# Patient Record
Sex: Male | Born: 1935 | Race: White | Hispanic: No | Smoking: Former smoker
Health system: Southern US, Community
[De-identification: ages and names within clinical notes are randomized; demographics above are authoritative.]

## PROBLEM LIST (undated history)

## (undated) DIAGNOSIS — F0390 Unspecified dementia without behavioral disturbance: Secondary | ICD-10-CM

## (undated) DIAGNOSIS — I10 Essential (primary) hypertension: Secondary | ICD-10-CM

## (undated) DIAGNOSIS — E785 Hyperlipidemia, unspecified: Secondary | ICD-10-CM

## (undated) DIAGNOSIS — J449 Chronic obstructive pulmonary disease, unspecified: Secondary | ICD-10-CM

## (undated) DIAGNOSIS — K219 Gastro-esophageal reflux disease without esophagitis: Secondary | ICD-10-CM

## (undated) HISTORY — PX: NO PAST SURGERIES: SHX2092

---

## 2017-10-24 ENCOUNTER — Inpatient Hospital Stay (HOSPITAL_COMMUNITY)
Admission: EM | Admit: 2017-10-24 | Discharge: 2017-10-26 | DRG: 871 | Disposition: A | Discharge status: Home Health | Payer: Medicare Other | Attending: Internal Medicine | Admitting: Internal Medicine

## 2017-10-24 ENCOUNTER — Emergency Department (HOSPITAL_COMMUNITY): Payer: Medicare Other

## 2017-10-24 ENCOUNTER — Inpatient Hospital Stay (HOSPITAL_COMMUNITY): Payer: Medicare Other

## 2017-10-24 ENCOUNTER — Encounter (HOSPITAL_COMMUNITY): Payer: Self-pay | Admitting: Emergency Medicine

## 2017-10-24 DIAGNOSIS — A419 Sepsis, unspecified organism: Principal | ICD-10-CM

## 2017-10-24 DIAGNOSIS — J440 Chronic obstructive pulmonary disease with acute lower respiratory infection: Secondary | ICD-10-CM | POA: Diagnosis present

## 2017-10-24 DIAGNOSIS — Z9981 Dependence on supplemental oxygen: Secondary | ICD-10-CM

## 2017-10-24 DIAGNOSIS — Z885 Allergy status to narcotic agent status: Secondary | ICD-10-CM

## 2017-10-24 DIAGNOSIS — K219 Gastro-esophageal reflux disease without esophagitis: Secondary | ICD-10-CM

## 2017-10-24 DIAGNOSIS — S2231XA Fracture of one rib, right side, initial encounter for closed fracture: Secondary | ICD-10-CM | POA: Diagnosis present

## 2017-10-24 DIAGNOSIS — J181 Lobar pneumonia, unspecified organism: Secondary | ICD-10-CM | POA: Diagnosis present

## 2017-10-24 DIAGNOSIS — F0390 Unspecified dementia without behavioral disturbance: Secondary | ICD-10-CM | POA: Diagnosis present

## 2017-10-24 DIAGNOSIS — I10 Essential (primary) hypertension: Secondary | ICD-10-CM | POA: Diagnosis present

## 2017-10-24 DIAGNOSIS — G8929 Other chronic pain: Secondary | ICD-10-CM | POA: Diagnosis present

## 2017-10-24 DIAGNOSIS — Z7951 Long term (current) use of inhaled steroids: Secondary | ICD-10-CM

## 2017-10-24 DIAGNOSIS — E876 Hypokalemia: Secondary | ICD-10-CM | POA: Diagnosis present

## 2017-10-24 DIAGNOSIS — R195 Other fecal abnormalities: Secondary | ICD-10-CM | POA: Diagnosis present

## 2017-10-24 DIAGNOSIS — E785 Hyperlipidemia, unspecified: Secondary | ICD-10-CM

## 2017-10-24 DIAGNOSIS — F329 Major depressive disorder, single episode, unspecified: Secondary | ICD-10-CM | POA: Diagnosis present

## 2017-10-24 DIAGNOSIS — W19XXXA Unspecified fall, initial encounter: Secondary | ICD-10-CM | POA: Diagnosis present

## 2017-10-24 DIAGNOSIS — Z87891 Personal history of nicotine dependence: Secondary | ICD-10-CM

## 2017-10-24 DIAGNOSIS — G9341 Metabolic encephalopathy: Secondary | ICD-10-CM | POA: Diagnosis present

## 2017-10-24 DIAGNOSIS — K921 Melena: Secondary | ICD-10-CM | POA: Diagnosis present

## 2017-10-24 DIAGNOSIS — J189 Pneumonia, unspecified organism: Secondary | ICD-10-CM

## 2017-10-24 HISTORY — DX: Gastro-esophageal reflux disease without esophagitis: K21.9

## 2017-10-24 HISTORY — DX: Chronic obstructive pulmonary disease, unspecified: J44.9

## 2017-10-24 HISTORY — DX: Unspecified dementia, unspecified severity, without behavioral disturbance, psychotic disturbance, mood disturbance, and anxiety: F03.90

## 2017-10-24 HISTORY — DX: Essential (primary) hypertension: I10

## 2017-10-24 HISTORY — DX: Hyperlipidemia, unspecified: E78.5

## 2017-10-24 LAB — URINALYSIS, ROUTINE W REFLEX MICROSCOPIC
Glucose, UA: NEGATIVE mg/dL
HGB URINE DIPSTICK: NEGATIVE
Ketones, ur: 5 mg/dL — AB
Nitrite: NEGATIVE
PROTEIN: 100 mg/dL — AB
Specific Gravity, Urine: 1.026 (ref 1.005–1.030)
pH: 5 (ref 5.0–8.0)

## 2017-10-24 LAB — CBC WITH DIFFERENTIAL/PLATELET
BASOS ABS: 0.1 10*3/uL (ref 0.0–0.1)
Basophils Relative: 0 %
EOS PCT: 0 %
Eosinophils Absolute: 0.1 10*3/uL (ref 0.0–0.7)
HEMATOCRIT: 31.5 % — AB (ref 39.0–52.0)
HEMOGLOBIN: 10.4 g/dL — AB (ref 13.0–17.0)
LYMPHS ABS: 1.2 10*3/uL (ref 0.7–4.0)
LYMPHS PCT: 7 %
MCH: 34.4 pg — AB (ref 26.0–34.0)
MCHC: 33 g/dL (ref 30.0–36.0)
MCV: 104.3 fL — AB (ref 78.0–100.0)
Monocytes Absolute: 2.3 10*3/uL — ABNORMAL HIGH (ref 0.1–1.0)
Monocytes Relative: 14 %
NEUTROS ABS: 13.2 10*3/uL — AB (ref 1.7–7.7)
NEUTROS PCT: 79 %
Platelets: 334 10*3/uL (ref 150–400)
RBC: 3.02 MIL/uL — AB (ref 4.22–5.81)
RDW: 15 % (ref 11.5–15.5)
WBC: 16.9 10*3/uL — AB (ref 4.0–10.5)

## 2017-10-24 LAB — COMPREHENSIVE METABOLIC PANEL
ALT: 29 U/L (ref 17–63)
ANION GAP: 10 (ref 5–15)
AST: 34 U/L (ref 15–41)
Albumin: 3.1 g/dL — ABNORMAL LOW (ref 3.5–5.0)
Alkaline Phosphatase: 76 U/L (ref 38–126)
BUN: 15 mg/dL (ref 6–20)
CHLORIDE: 103 mmol/L (ref 101–111)
CO2: 29 mmol/L (ref 22–32)
Calcium: 8.8 mg/dL — ABNORMAL LOW (ref 8.9–10.3)
Creatinine, Ser: 1.15 mg/dL (ref 0.61–1.24)
GFR calc Af Amer: >60 mL/min (ref >60)
GFR, EST NON AFRICAN AMERICAN: 58 mL/min — AB (ref >60)
Glucose, Bld: 146 mg/dL — ABNORMAL HIGH (ref 65–99)
POTASSIUM: 3.2 mmol/L — AB (ref 3.5–5.1)
Sodium: 142 mmol/L (ref 135–145)
TOTAL PROTEIN: 7.5 g/dL (ref 6.5–8.1)
Total Bilirubin: 0.8 mg/dL (ref 0.3–1.2)

## 2017-10-24 LAB — APTT: aPTT: 32 seconds (ref 24–36)

## 2017-10-24 LAB — PROCALCITONIN: Procalcitonin: 0.29 ng/mL

## 2017-10-24 LAB — I-STAT TROPONIN, ED: TROPONIN I, POC: 0.02 ng/mL (ref 0.00–0.08)

## 2017-10-24 LAB — ABO/RH: ABO/RH(D): O POS

## 2017-10-24 LAB — TYPE AND SCREEN
ABO/RH(D): O POS
ANTIBODY SCREEN: NEGATIVE

## 2017-10-24 LAB — LIPASE, BLOOD: Lipase: 26 U/L (ref 11–51)

## 2017-10-24 LAB — LACTIC ACID, PLASMA: Lactic Acid, Venous: 1 mmol/L (ref 0.5–1.9)

## 2017-10-24 LAB — POC OCCULT BLOOD, ED: FECAL OCCULT BLD: NEGATIVE

## 2017-10-24 LAB — I-STAT CG4 LACTIC ACID, ED
Lactic Acid, Venous: 2 mmol/L (ref 0.5–1.9)
Lactic Acid, Venous: 1.15 mmol/L (ref 0.5–1.9)

## 2017-10-24 LAB — INFLUENZA PANEL BY PCR (TYPE A & B)
INFLAPCR: NEGATIVE
INFLBPCR: NEGATIVE

## 2017-10-24 LAB — PROTIME-INR
INR: 1.04
Prothrombin Time: 13.5 seconds (ref 11.4–15.2)

## 2017-10-24 MED ORDER — LISINOPRIL 20 MG PO TABS
40.0000 mg | ORAL_TABLET | Freq: Every morning | ORAL | Status: DC
  Administered 2017-10-25 – 2017-10-26 (×2): 40 mg via ORAL
  Filled 2017-10-24 (×2): qty 2

## 2017-10-24 MED ORDER — FLUTICASONE FUROATE-VILANTEROL 100-25 MCG/INH IN AEPB
1.0000 | INHALATION_SPRAY | Freq: Every day | RESPIRATORY_TRACT | Status: DC
  Administered 2017-10-25 – 2017-10-26 (×2): 1 via RESPIRATORY_TRACT
  Filled 2017-10-24: qty 28

## 2017-10-24 MED ORDER — DULOXETINE HCL 60 MG PO CPEP
60.0000 mg | ORAL_CAPSULE | Freq: Every evening | ORAL | Status: DC
  Administered 2017-10-25 (×2): 60 mg via ORAL
  Filled 2017-10-24 (×2): qty 1

## 2017-10-24 MED ORDER — HYDROCODONE-ACETAMINOPHEN 5-325 MG PO TABS: 1.0000 | ORAL_TABLET | Freq: Three times a day (TID) | ORAL | Status: DC | PRN

## 2017-10-24 MED ORDER — SODIUM CHLORIDE 0.9 % IV BOLUS (SEPSIS)
500.0000 mL | Freq: Once | INTRAVENOUS | Status: AC
  Administered 2017-10-24: 500 mL via INTRAVENOUS

## 2017-10-24 MED ORDER — FLUTICASONE PROPIONATE 50 MCG/ACT NA SUSP
1.0000 | Freq: Every day | NASAL | Status: DC
  Administered 2017-10-25 – 2017-10-26 (×2): 1 via NASAL
  Filled 2017-10-24: qty 16

## 2017-10-24 MED ORDER — GABAPENTIN 400 MG PO CAPS
400.0000 mg | ORAL_CAPSULE | Freq: Three times a day (TID) | ORAL | Status: DC
  Administered 2017-10-25 – 2017-10-26 (×5): 400 mg via ORAL
  Filled 2017-10-24 (×5): qty 1

## 2017-10-24 MED ORDER — FAMOTIDINE 20 MG PO TABS
20.0000 mg | ORAL_TABLET | Freq: Every day | ORAL | Status: DC
  Administered 2017-10-24 – 2017-10-25 (×2): 20 mg via ORAL
  Filled 2017-10-24 (×2): qty 1

## 2017-10-24 MED ORDER — SODIUM CHLORIDE 0.9 % IV BOLUS (SEPSIS)
1500.0000 mL | Freq: Once | INTRAVENOUS | Status: AC
  Administered 2017-10-24: 1000 mL via INTRAVENOUS

## 2017-10-24 MED ORDER — POTASSIUM CHLORIDE 20 MEQ/15ML (10%) PO SOLN
40.0000 meq | Freq: Once | ORAL | Status: AC
  Administered 2017-10-24: 40 meq via ORAL
  Filled 2017-10-24: qty 30

## 2017-10-24 MED ORDER — DEXTROSE 5 % IV SOLN
500.0000 mg | INTRAVENOUS | Status: AC
  Administered 2017-10-25 – 2017-10-26 (×2): 500 mg via INTRAVENOUS
  Filled 2017-10-24 (×2): qty 500

## 2017-10-24 MED ORDER — IPRATROPIUM-ALBUTEROL 0.5-2.5 (3) MG/3ML IN SOLN
3.0000 mL | RESPIRATORY_TRACT | Status: DC
  Administered 2017-10-24: 3 mL via RESPIRATORY_TRACT
  Filled 2017-10-24: qty 3

## 2017-10-24 MED ORDER — ALBUTEROL SULFATE (2.5 MG/3ML) 0.083% IN NEBU: 2.5000 mg | INHALATION_SOLUTION | Freq: Two times a day (BID) | RESPIRATORY_TRACT | Status: DC

## 2017-10-24 MED ORDER — AZITHROMYCIN 500 MG IV SOLR
500.0000 mg | Freq: Once | INTRAVENOUS | Status: AC
  Administered 2017-10-24: 500 mg via INTRAVENOUS
  Filled 2017-10-24: qty 500

## 2017-10-24 MED ORDER — CYANOCOBALAMIN 1000 MCG/ML IJ SOLN
1000.0000 ug | INTRAMUSCULAR | Status: DC
  Administered 2017-10-25: 1000 ug via INTRAMUSCULAR
  Filled 2017-10-24: qty 1

## 2017-10-24 MED ORDER — DEXTROSE 5 % IV SOLN
1.0000 g | INTRAVENOUS | Status: DC
  Administered 2017-10-25: 1 g via INTRAVENOUS
  Filled 2017-10-24 (×2): qty 10

## 2017-10-24 MED ORDER — ATORVASTATIN CALCIUM 20 MG PO TABS
20.0000 mg | ORAL_TABLET | Freq: Every day | ORAL | Status: DC
  Administered 2017-10-24 – 2017-10-25 (×2): 20 mg via ORAL
  Filled 2017-10-24 (×2): qty 1

## 2017-10-24 MED ORDER — DM-GUAIFENESIN ER 30-600 MG PO TB12
1.0000 | ORAL_TABLET | Freq: Two times a day (BID) | ORAL | Status: DC | PRN
  Filled 2017-10-24: qty 1

## 2017-10-24 MED ORDER — LORATADINE 10 MG PO TABS
10.0000 mg | ORAL_TABLET | Freq: Every day | ORAL | Status: DC
  Administered 2017-10-25 – 2017-10-26 (×3): 10 mg via ORAL
  Filled 2017-10-24 (×3): qty 1

## 2017-10-24 MED ORDER — DEXTROSE 5 % IV SOLN
1.0000 g | Freq: Once | INTRAVENOUS | Status: AC
  Administered 2017-10-24: 1 g via INTRAVENOUS
  Filled 2017-10-24: qty 10

## 2017-10-24 MED ORDER — BUSPIRONE HCL 10 MG PO TABS
10.0000 mg | ORAL_TABLET | Freq: Two times a day (BID) | ORAL | Status: DC
  Administered 2017-10-25 – 2017-10-26 (×4): 10 mg via ORAL
  Filled 2017-10-24 (×4): qty 1

## 2017-10-24 MED ORDER — SODIUM CHLORIDE 0.9 % IV SOLN
INTRAVENOUS | Status: DC
  Administered 2017-10-25 (×2): via INTRAVENOUS

## 2017-10-24 MED ORDER — TRAZODONE HCL 50 MG PO TABS
50.0000 mg | ORAL_TABLET | Freq: Every day | ORAL | Status: DC
  Administered 2017-10-25: 50 mg via ORAL
  Filled 2017-10-24 (×2): qty 1

## 2017-10-24 MED ORDER — PRIMIDONE 50 MG PO TABS
50.0000 mg | ORAL_TABLET | Freq: Two times a day (BID) | ORAL | Status: DC
  Administered 2017-10-24 – 2017-10-26 (×4): 50 mg via ORAL
  Filled 2017-10-24 (×4): qty 1

## 2017-10-24 MED ORDER — TIOTROPIUM BROMIDE MONOHYDRATE 18 MCG IN CAPS: 1.0000 | ORAL_CAPSULE | Freq: Every day | RESPIRATORY_TRACT | Status: DC

## 2017-10-24 MED ORDER — ALBUTEROL SULFATE (2.5 MG/3ML) 0.083% IN NEBU: 2.5000 mg | INHALATION_SOLUTION | RESPIRATORY_TRACT | Status: DC | PRN

## 2017-10-24 MED ORDER — IPRATROPIUM-ALBUTEROL 0.5-2.5 (3) MG/3ML IN SOLN
3.0000 mL | Freq: Two times a day (BID) | RESPIRATORY_TRACT | Status: DC
  Administered 2017-10-25 – 2017-10-26 (×3): 3 mL via RESPIRATORY_TRACT
  Filled 2017-10-24 (×3): qty 3

## 2017-10-24 NOTE — ED Notes (Signed)
Patient's daughter, Willia CrazeLinda Lopez, called stating she will be back in the morning to check on patient but can be reached at [phone] if needed.

## 2017-10-24 NOTE — ED Notes (Signed)
Patient transported to CT 

## 2017-10-24 NOTE — ED Notes (Signed)
ED Provider at bedside. 

## 2017-10-24 NOTE — ED Provider Notes (Signed)
[facility]-EMERGENCY DEPT Provider Note   CSN: [id number] Arrival date & time: 10/24/17  1202     History   Chief Complaint Chief Complaint  Patient presents with  . Weakness  . Melena    HPI Misha Antonini is a 82 y.o. male.  HPI 82 year old Caucasian who is unable to give me a full history and physical who has no prior medical records in our system presents to the ED from EMS today from home with complaints of generalized weakness and dark tarry stools.  Patient reports that his stools have been darker for the past 3 days.  Also reports generalized abdominal pain.  Again patient is a poor historian. Seems confused. Per EMS patient has had increased weakness and falls of the past 3 days with black tarry stools.  Family is not at bedside at this time.  Patient reports some right rib pain from his fall yesterday.  He does deny any head injuries or loss of consciousness.  Denies any associated chest pain or shortness of breath.  Patient denies diarrhea.  Denies any cough, fever.  Does report some difficulty urinating.  Was able to review patient's prior notes from [facility].  The patient does have a chronic COPD on 2 L of oxygen at baseline, chronic dizziness, chronic pain, hypertension.  She was seen by PCP on 06/2017 for checkup at that time.  Patient had unremarkable blood work.  Chest x-ray at that time was normal.   Past Medical History:  Diagnosis Date  . COPD (chronic obstructive pulmonary disease) (HCC)     There are no active problems to display for this patient.        Home Medications    Prior to Admission medications   Not on File    Family History No family history on file.  Social History Social History   Tobacco Use  . Smoking status: Not on file  Substance Use Topics  . Alcohol use: Not on file  . Drug use: Not on file     Allergies   Patient has no known allergies.   Review of Systems Review of Systems    Constitutional: Positive for fatigue. Negative for chills and fever.  HENT: Negative for congestion.   Eyes: Negative for visual disturbance.  Respiratory: Negative for shortness of breath.   Cardiovascular: Negative for chest pain.  Gastrointestinal: Positive for abdominal pain and blood in stool. Negative for constipation, diarrhea, nausea, rectal pain and vomiting.  Genitourinary: Positive for decreased urine volume. Negative for dysuria, flank pain, frequency, hematuria and urgency.  Musculoskeletal: Negative for myalgias.  Skin: Negative for rash.  Neurological: Positive for weakness. Negative for dizziness, light-headedness, numbness and headaches.     Physical Exam Updated Vital Signs BP 130/60 (BP Location: Right Arm)   Pulse (!) 103   Temp 99.1 F (37.3 C) (Rectal)   Resp (!) 25   SpO2 100%   Physical Exam  Constitutional: He is oriented to person, place, and time.  Non-toxic appearance. No distress.  Chronically ill-appearing.Cachetic ill appearing male wearing oxygen 2 L02 [place]  HENT:  Head: Normocephalic and atraumatic.  Mouth/Throat: Oropharynx is clear and moist.  Eyes: Conjunctivae are normal. Pupils are equal, round, and reactive to light. Right eye exhibits no discharge. Left eye exhibits no discharge.  Neck: Normal range of motion. Neck supple.  No c spine midline tenderness. No paraspinal tenderness. No deformities or step offs noted. Full ROM. Supple. No nuchal rigidity.    Cardiovascular:  Regular rhythm, normal heart sounds and intact distal pulses. Exam reveals no gallop and no friction rub.  No murmur heard. Tachycardia  Pulmonary/Chest: Effort normal and breath sounds normal. No stridor. No respiratory distress. He has no wheezes. He has no rales. He exhibits tenderness (right lower rib cage).  Abdominal: Soft. He exhibits no distension. Bowel sounds are increased. There is generalized tenderness. There is no rigidity, no rebound, no guarding, no CVA  tenderness, no tenderness at McBurney's point and negative Murphy's sign.  Genitourinary:  Genitourinary Comments: Chaperone present for exam. Pt tolerated without difficulty. No external hemorrhoids or fissures noted. No pain with palpation of the rectal vault. No internal hemorrhoids noted. Soft brown stool noted in the rectal vault. No gross hematochezia or melena. Hemoccult negative.   Musculoskeletal: Normal range of motion. He exhibits no tenderness.  ,No midline T spine or L spine tenderness. No deformities or step offs noted. Full ROM. Pelvis is stable.   Lymphadenopathy:    He has no cervical adenopathy.  Neurological: He is alert and oriented to person, place, and time.  Patient alert and oriented x3.  Follows, appropriately.  Answers questions properly.  All extremities any difficulties.  Skin: Skin is warm and dry. Capillary refill takes less than 2 seconds. No rash noted. There is pallor.  Psychiatric: His behavior is normal. Judgment and thought content normal.  Nursing note and vitals reviewed.    ED Treatments / Results  Labs (all labs ordered are listed, but only abnormal results are displayed) Labs Reviewed  COMPREHENSIVE METABOLIC PANEL - Abnormal; Notable for the following components:      Result Value   Potassium 3.2 (*)    Glucose, Bld 146 (*)    Calcium 8.8 (*)    Albumin 3.1 (*)    GFR calc non Af Amer 58 (*)    All other components within normal limits  CBC WITH DIFFERENTIAL/PLATELET - Abnormal; Notable for the following components:   WBC 16.9 (*)    RBC 3.02 (*)    Hemoglobin 10.4 (*)    HCT 31.5 (*)    MCV 104.3 (*)    MCH 34.4 (*)    Neutro Abs 13.2 (*)    Monocytes Absolute 2.3 (*)    All other components within normal limits  URINALYSIS, ROUTINE W REFLEX MICROSCOPIC - Abnormal; Notable for the following components:   Color, Urine AMBER (*)    APPearance HAZY (*)    Bilirubin Urine SMALL (*)    Ketones, ur 5 (*)    Protein, ur 100 (*)     Leukocytes, UA SMALL (*)    Bacteria, UA FEW (*)    Squamous Epithelial / LPF 0-5 (*)    All other components within normal limits  I-STAT CG4 LACTIC ACID, ED - Abnormal; Notable for the following components:   Lactic Acid, Venous 2.00 (*)    All other components within normal limits  URINE CULTURE  CULTURE, BLOOD (ROUTINE X 2)  CULTURE, BLOOD (ROUTINE X 2)  LIPASE, BLOOD  I-STAT TROPONIN, ED  POC OCCULT BLOOD, ED  I-STAT CG4 LACTIC ACID, ED    EKG  EKG Interpretation None       Radiology Dg Ribs Unilateral W/chest Right  Result Date: 10/24/2017 CLINICAL DATA:  Increase weakness and falls and dark tarry stools for the past 3 days. The patient is complaining of pain over the right lower lateral ribcage. EXAM: RIGHT RIBS AND CHEST - 3+ VIEW COMPARISON:  None in PACs FINDINGS:  The lungs are mildly hyperinflated. There is hazy increased density at the left lung base worrisome for pneumonia. There is no pleural effusion or pneumothorax. The heart and pulmonary vascularity are normal. There is calcification in the wall of the aortic arch. Right rib detail radiographs include a metallic BB over the symptomatic lower lateral right ribs. There is subtle contour deformity of the anterior lateral aspect of the right tenth rib. IMPRESSION: Probable nondisplaced fracture of the anterior-lateral aspect of the right tenth rib. COPD. Increased density at the left lung base worrisome for pneumonia. Followup PA and lateral chest X-ray is recommended in 3-4 weeks following trial of antibiotic therapy to ensure resolution and exclude underlying malignancy. Thoracic aortic atherosclerosis. Electronically Signed   By: David  SwazilandJordan M.D.   On: 10/24/2017 13:46    Procedures .Critical Care Performed by: Rise MuLeaphart, Kenneth T, PA-C Authorized by: Rise MuLeaphart, Kenneth T, PA-C   Critical care provider statement:    Critical care time (minutes):  30   Critical care was necessary to treat or prevent imminent or  life-threatening deterioration of the following conditions:  Sepsis   Critical care was time spent personally by me on the following activities:  Development of treatment plan with patient or surrogate, discussions with consultants, examination of patient, evaluation of patient's response to treatment, ordering and performing treatments and interventions, ordering and review of laboratory studies, ordering and review of radiographic studies and pulse oximetry   (including critical care time)  Medications Ordered in ED Medications  azithromycin (ZITHROMAX) 500 mg in dextrose 5 % 250 mL IVPB (not administered)  cefTRIAXone (ROCEPHIN) 1 g in dextrose 5 % 50 mL IVPB (not administered)  sodium chloride 0.9 % bolus 500 mL (not administered)     Initial Impression / Assessment and Plan / ED Course  I have reviewed the triage vital signs and the nursing notes.  Pertinent labs & imaging results that were available during my care of the patient were reviewed by me and considered in my medical decision making (see chart for details).     Patient presents to the emergency department today with complaints of productive cough, generalized weakness, right rib pain, confusion.  Patient is a poor historian however did gather some information from daughter at bedside.  States that last time she was like this patient had pneumonia.  Patient does have a history of COPD is on 2 L of oxygen at baseline.  On exam patient is slightly altered.  Patient is afebrile.  Mildly tachycardic.  No hypotension.  No tachypnea noted.  Patient satting at 100% on 2 L.  On exam patient does have tenderness over the right lateral base.  No focal abdominal tenderness palpation.  No signs of ecchymosis, step-offs, edema.  No lower extremity edema or calf tenderness.  Patient has no other signs of focal trauma.  No midline neck tenderness.  Patient has no focal neuro deficit on exam.  Neurovascularly intact in all  extremities.  Patient had a leukocytosis of 16,000.  Mild hypokalemia at 3.2.  Hemoglobin of 10.4 which appears to be at patient's baseline.  Other lab work appears the patient's baseline.  UA does show too numerous to count WBCs few bacteria.  Patient's lipase is normal.  Troponin is negative.  Lactic acid is mildly elevated at 2.  Chest x-ray performed shows a nondisplaced rib fracture of the right 10th rib.  Also shows a left base infiltrate concerning for developing pneumonia.  Patient reports productive cough that is worse  than baseline with chills and some confusion.  Given the elevated lactic acid with tachycardia, tachypnea will start sepsis protocol.  Patient does not have a lactic acid before and is not hypotensive.  30 cc/kg fluid bolus was not ordered.  Blood cultures were obtained prior to antibiotic initiation.  Patient will be given gentle fluid resuscitation.  Urine culture was sent.  Patient treated with Rocephin and azithromycin.  Spoke with Dr. Clyde LundborgNiu with hospital medicine who agrees to admission and will see patient in the ED and place admission orders.  Patient remains hemodynamically stable at this time.  No hypotension noted.  Updated on plan of care.    Final Clinical Impressions(s) / ED Diagnoses   Final diagnoses:  Community acquired pneumonia of left lower lobe of lung (HCC)  Sepsis, due to unspecified organism [facility](HCC)    ED Discharge Orders    None       Wallace KellerLeaphart, Kenneth T, PA-C 10/24/17 1554    Rolland PorterJames, Mark, MD 10/26/17 [phone]

## 2017-10-24 NOTE — Progress Notes (Signed)
A consult was received from an ED physician for zithromax/rocephin per pharmacy dosing.  The patient's profile has been reviewed for ht/wt/allergies/indication/available labs.  A one time order has been placed for rocephin 1g and zithromax 500mg .  Further antibiotics/pharmacy consults should be ordered by admitting physician if indicated.                       Thank you, Berkley HarveyLegge, [place] Mallozzi Marshall 10/24/2017  2:53 PM

## 2017-10-24 NOTE — ED Notes (Signed)
Family at bedside. 

## 2017-10-24 NOTE — H&P (Addendum)
History and Physical    Adrian [name] ZHY:[id number] DOB: [birth year] DOA: 10/24/2017  Referring MD/NP/PA:   PCP: Adrian [name]   Patient coming from:  The patient is coming from home.  At baseline, pt is independent for most of ADL.   Chief Complaint: Confusion, cough, shortness of breath,  tarry stool, fall   HPI: Adrian [name] is a 82 y.o. male with medical history significant of hypertension, hyperlipidemia, COPD, on 2 L nasal cannula oxygen at home, GERD, depression, dementia, who presents with confusion, cough, shortness breath, tarry stool.  Patient states that he has been having cough, shortness of breath recently, which has worsened since yesterday.  She coughs up white mucus, fever or chills.  Patient states that he had fall caused injury to bilateral lower rib cage.  He has moderate pain in lower rib cages bilaterally.  Patient reports that she noted tarry and dark stool in the past 3 days.  He denies nausea, vomiting, diarrhea or abdominal pain.  He does not have chest pain, but reports chest wall pain.  He moves all extremities normally.  Patient is confused, disoriented to place, but not to time and person.  ED Course: pt was found to have WBC 16.9, urinalysis with small amount of leukocyte, negative FOBT, potassium 3.2, creatinine 1.15, temperature 95.1, tachycardia, tachypnea, oxygen saturation 85% on room air.  X-ray of ribs/chest showed possible right 10th rib fracture and possible left lower field infiltration.  Patient is admitted to telemetry bed as inpatient.  Review of Systems:   General: no fevers, chills, no body weight gain, has poor appetite, has fatigue HEENT: no blurry vision, hearing changes or sore throat Respiratory: has dyspnea, coughing, no wheezing CV: no chest pain, no palpitations. Has rib cage pain. GI: no nausea, vomiting, abdominal pain, diarrhea, constipation GU: no dysuria, burning on urination, increased urinary frequency, hematuria  Ext:  no leg edema Neuro: no unilateral weakness, numbness, or tingling, no vision change or hearing loss Skin: no rash, no skin tear. MSK: No muscle spasm, no deformity, no limitation of range of movement in spin Heme: No easy bruising.  Travel history: No recent long distant travel.  Allergy:  Allergies  Allergen Reactions  . Oxycodone Other (See Comments)    Other reaction(s): ANOREXIA,SLEEPLESSNES FAMILY MEMBER SAYS HE CAN TOLERATE 5 MG WELL    Past Medical History:  Diagnosis Date  . COPD (chronic obstructive pulmonary disease) (HCC)   . Dementia   . Essential hypertension   . GERD (gastroesophageal reflux disease)   . HLD (hyperlipidemia)     Past Surgical History:  Procedure Laterality Date  . NO PAST SURGERIES      Social History:  reports that he quit smoking about 5 years ago. He has quit using smokeless tobacco. His smokeless tobacco use included chew. He reports that he does not drink alcohol or use drugs.  Family History: could not be reviewed due to altered mental status.  Prior to Admission medications   Medication Sig Start Date End Date Taking? Authorizing Provider  atorvastatin (LIPITOR) 20 MG tablet Take 20 mg by mouth daily. 07/26/17   [provider]  BREO ELLIPTA 100-25 MCG/INH AEPB Inhale 1 puff into the lungs daily. 10/18/17   [provider]  busPIRone (BUSPAR) 10 MG tablet Take 10 mg by mouth 2 (two) times daily as needed for anxiety. 10/18/17   [provider]  cetirizine (ZYRTEC) 10 MG tablet Take 10 mg by mouth daily. 10/18/17   [provider]  cyanocobalamin (,VITAMIN B-12,) 1000 MCG/ML injection Inject 1,000 mcg into the muscle every 30 (thirty) days. 09/30/17   [provider]  DULoxetine (CYMBALTA) 60 MG capsule Take 60 mg by mouth every evening. 08/23/17   [provider]  gabapentin (NEURONTIN) 100 MG capsule Take 100 mg by mouth. FOLLOW TAPER SCHEDULE 05/10/17 05/10/18  [provider]    gabapentin (NEURONTIN) 400 MG capsule Take 400 mg by mouth. FOLLOW TAPER SCHEDULE 08/07/17   [provider]  gabapentin (NEURONTIN) 600 MG tablet Take 600 mg by mouth every 8 (eight) hours. 10/05/17   [provider]  HYDROcodone-acetaminophen (NORCO/VICODIN) 5-325 MG tablet Take 1 tablet by mouth every 8 (eight) hours as needed for pain. 07/05/17   [provider]  ipratropium (ATROVENT) 0.03 % nasal spray  10/23/17   [provider]  lisinopril (PRINIVIL,ZESTRIL) 40 MG tablet Take 40 mg by mouth every morning. 08/23/17   [provider]  mometasone (NASONEX) 50 MCG/ACT nasal spray SHAKE LQ AND U 1 SPR IEN D 08/30/17   [provider]  primidone (MYSOLINE) 50 MG tablet Take 50 mg by mouth 2 (two) times daily. 07/30/17   [provider]  PROAIR HFA 108 (90 Base) MCG/ACT inhaler Inhale 2 puffs into the lungs every 4 (four) hours as needed for wheezing. 07/30/17   [provider]  ranitidine (ZANTAC) 150 MG tablet Take 150 mg by mouth 4 (four) times daily. 08/30/17   [provider]  tiotropium (SPIRIVA) 18 MCG inhalation capsule Place 1 capsule into inhaler and inhale. 09/04/16   [provider]  traZODone (DESYREL) 50 MG tablet Take 50 mg by mouth. 30 MINUTES BEFORE BED FOR INSOMNIA 08/27/17   [provider]    Physical Exam: Vitals:   10/24/17 1656 10/24/17 1730 10/24/17 1800 10/24/17 1929  BP:  (!) 138/59 134/76   Pulse:  88 74   Resp:  (!) 24 (!) 24   Temp:      TempSrc:      SpO2:  98% 97% 98%  Weight: 50.3 kg (111 lb)      General: Not in acute distress.  Cachectic looking. HEENT:       Eyes: PERRL, EOMI, no scleral icterus.       ENT: No discharge from the ears and nose, no pharynx injection, no tonsillar enlargement.        Neck: No JVD, no bruit, no mass felt. Heme: No neck lymph node enlargement. Cardiac: S1/S2, RRR, No murmurs, No gallops or rubs. Respiratory: No rales,  wheezing, rhonchi or rubs. GI: Soft, nondistended, nontender, no rebound pain, no organomegaly, BS present. GU: No hematuria Ext: No pitting leg edema bilaterally. 2+DP/PT pulse bilaterally. Musculoskeletal: No joint deformities, No joint redness or warmth, no limitation of ROM in spin. Skin: No rashes.  Neuro: Alert, oriented to place, but not to time X3, cranial nerves II-XII grossly intact, moves all extremities normally.  Psych: Patient is not psychotic, no suicidal or hemocidal ideation.  Labs on Admission: I have personally reviewed following labs and imaging studies  CBC: Recent Labs  Lab 10/24/17 1300  WBC 16.9*  NEUTROABS 13.2*  HGB 10.4*  HCT 31.5*  MCV 104.3*  PLT 334   Basic Metabolic Panel: Recent Labs  Lab 10/24/17 1300  NA 142  K 3.2*  CL 103  CO2 29  GLUCOSE 146*  BUN 15  CREATININE 1.15  CALCIUM 8.8*   GFR: CrCl cannot be calculated (Unknown ideal weight.).  Liver Function Tests: Recent Labs  Lab 10/24/17 1300  AST 34  ALT 29  ALKPHOS 76  BILITOT 0.8  PROT 7.5  ALBUMIN 3.1*   Recent Labs  Lab 10/24/17 1300  LIPASE 26   No results for input(s): AMMONIA in the last 168 hours. Coagulation Profile: No results for input(s): INR, PROTIME in the last 168 hours. Cardiac Enzymes: No results for input(s): CKTOTAL, CKMB, CKMBINDEX, TROPONINI in the last 168 hours. BNP (last 3 results) No results for input(s): PROBNP in the last 8760 hours. HbA1C: No results for input(s): HGBA1C in the last 72 hours. CBG: No results for input(s): GLUCAP in the last 168 hours. Lipid Profile: No results for input(s): CHOL, HDL, LDLCALC, TRIG, CHOLHDL, LDLDIRECT in the last 72 hours. Thyroid Function Tests: No results for input(s): TSH, T4TOTAL, FREET4, T3FREE, THYROIDAB in the last 72 hours. Anemia Panel: No results for input(s): VITAMINB12, FOLATE, FERRITIN, TIBC, IRON, RETICCTPCT in the last 72 hours. Urine analysis:    Component Value Date/Time    COLORURINE AMBER (A) 10/24/2017 1452   APPEARANCEUR HAZY (A) 10/24/2017 1452   LABSPEC 1.026 10/24/2017 1452   PHURINE 5.0 10/24/2017 1452   GLUCOSEU NEGATIVE 10/24/2017 1452   HGBUR NEGATIVE 10/24/2017 1452   BILIRUBINUR SMALL (A) 10/24/2017 1452   KETONESUR 5 (A) 10/24/2017 1452   PROTEINUR 100 (A) 10/24/2017 1452   NITRITE NEGATIVE 10/24/2017 1452   LEUKOCYTESUR SMALL (A) 10/24/2017 1452   Sepsis Labs: @LABRCNTIP (procalcitonin:4,lacticidven:4) )No results found for this or any previous visit (from the past 240 hour(s)).   Radiological Exams on Admission: Dg Ribs Unilateral W/chest Right  Result Date: 10/24/2017 CLINICAL DATA:  Increase weakness and falls and dark tarry stools for the past 3 days. The patient is complaining of pain over the right lower lateral ribcage. EXAM: RIGHT RIBS AND CHEST - 3+ VIEW COMPARISON:  None in PACs FINDINGS: The lungs are mildly hyperinflated. There is hazy increased density at the left lung base worrisome for pneumonia. There is no pleural effusion or pneumothorax. The heart and pulmonary vascularity are normal. There is calcification in the wall of the aortic arch. Right rib detail radiographs include a metallic BB over the symptomatic lower lateral right ribs. There is subtle contour deformity of the anterior lateral aspect of the right tenth rib. IMPRESSION: Probable nondisplaced fracture of the anterior-lateral aspect of the right tenth rib. COPD. Increased density at the left lung base worrisome for pneumonia. Followup PA and lateral chest X-ray is recommended in 3-4 weeks following trial of antibiotic therapy to ensure resolution and exclude underlying malignancy. Thoracic aortic atherosclerosis. Electronically Signed   By: David  [place] M.D.   On: 10/24/2017 13:46   Ct Head Wo Contrast  Result Date: 10/24/2017 CLINICAL DATA:  82 year old male with history of weakness and falling EXAM: CT HEAD WITHOUT CONTRAST CT CERVICAL SPINE WITHOUT CONTRAST  TECHNIQUE: Multidetector CT imaging of the head and cervical spine was performed following the standard protocol without intravenous contrast. Multiplanar CT image reconstructions of the cervical spine were also generated. COMPARISON:  None. FINDINGS: CT HEAD FINDINGS Brain: No acute hemorrhage. No midline shift or mass effect. Unremarkable configuration of the ventricles. Gray-white differentiation maintained. Mild brain volume loss. Vascular: Calcifications of the anterior circulation Skull: No acute fracture.  No aggressive bony lesions. Sinuses/Orbits: Unremarkable orbits. Unremarkable paranasal sinuses without significant paranasal sinus disease. Other: None CT CERVICAL SPINE FINDINGS Alignment: Alignment maintained. No subluxation. Cranial cervical junction maintained Skull base and vertebrae: Craniocervical junction maintained. No skullbase fracture.  No acute fracture of the vertebral bodies. Facet alignment maintained. Vertebral body heights maintained. Soft tissues and spinal canal: No prevertebral soft tissue swelling. No canal hematoma Disc levels: C2-C3: Disc space narrowing with uncovertebral joint disease and no significant foraminal narrowing. Facet disease present. C3-C4: Degenerative disc disease with bilateral uncovertebral joint disease and facet disease contributes to mild bilateral foraminal narrowing. C4-C5: Degenerative disc disease with posterior disc osteophyte complex and bilateral uncovertebral joint disease contributes to left greater than right foraminal narrowing. C5-C6: Disc space narrowing with bilateral uncovertebral joint disease contributes to left greater than right foraminal narrowing. C6-C7: Disc disease with posterior disc osteophyte complex and no significant canal narrowing or foraminal narrowing. C7-T1: No significant canal narrowing or foraminal narrowing. Upper chest: Emphysema of the lung apices. Pleuroparenchymal scarring. Other: Carotid and vertebral artery  calcifications. IMPRESSION: Head CT: Head CT negative for acute intracranial abnormality. Mild senescent brain volume loss. Intracranial atherosclerosis. Cervical CT: No CT evidence of acute fracture malalignment of the cervical spine. Calcifications of the bilateral carotid arteries and vertebral arteries. Multilevel degenerative disc disease with associated uncovertebral joint disease contributing to left-greater-than-right foraminal narrowing at C4-C5 and C5-C6. Emphysema Electronically Signed   By: Gilmer Mor D.O.   On: 10/24/2017 18:56   Ct Cervical Spine Wo Contrast  Result Date: 10/24/2017 CLINICAL DATA:  82 year old male with history of weakness and falling EXAM: CT HEAD WITHOUT CONTRAST CT CERVICAL SPINE WITHOUT CONTRAST TECHNIQUE: Multidetector CT imaging of the head and cervical spine was performed following the standard protocol without intravenous contrast. Multiplanar CT image reconstructions of the cervical spine were also generated. COMPARISON:  None. FINDINGS: CT HEAD FINDINGS Brain: No acute hemorrhage. No midline shift or mass effect. Unremarkable configuration of the ventricles. Gray-white differentiation maintained. Mild brain volume loss. Vascular: Calcifications of the anterior circulation Skull: No acute fracture.  No aggressive bony lesions. Sinuses/Orbits: Unremarkable orbits. Unremarkable paranasal sinuses without significant paranasal sinus disease. Other: None CT CERVICAL SPINE FINDINGS Alignment: Alignment maintained. No subluxation. Cranial cervical junction maintained Skull base and vertebrae: Craniocervical junction maintained. No skullbase fracture. No acute fracture of the vertebral bodies. Facet alignment maintained. Vertebral body heights maintained. Soft tissues and spinal canal: No prevertebral soft tissue swelling. No canal hematoma Disc levels: C2-C3: Disc space narrowing with uncovertebral joint disease and no significant foraminal narrowing. Facet disease present.  C3-C4: Degenerative disc disease with bilateral uncovertebral joint disease and facet disease contributes to mild bilateral foraminal narrowing. C4-C5: Degenerative disc disease with posterior disc osteophyte complex and bilateral uncovertebral joint disease contributes to left greater than right foraminal narrowing. C5-C6: Disc space narrowing with bilateral uncovertebral joint disease contributes to left greater than right foraminal narrowing. C6-C7: Disc disease with posterior disc osteophyte complex and no significant canal narrowing or foraminal narrowing. C7-T1: No significant canal narrowing or foraminal narrowing. Upper chest: Emphysema of the lung apices. Pleuroparenchymal scarring. Other: Carotid and vertebral artery calcifications. IMPRESSION: Head CT: Head CT negative for acute intracranial abnormality. Mild senescent brain volume loss. Intracranial atherosclerosis. Cervical CT: No CT evidence of acute fracture malalignment of the cervical spine. Calcifications of the bilateral carotid arteries and vertebral arteries. Multilevel degenerative disc disease with associated uncovertebral joint disease contributing to left-greater-than-right foraminal narrowing at C4-C5 and C5-C6. Emphysema Electronically Signed   By: Gilmer Mor D.O.   On: 10/24/2017 18:56     EKG: Independently reviewed.  Sinus rhythm, QTC 482, LAD, nonspecific T wave change. Assessment/Plan Principal Problem:   Lobar pneumonia (HCC) Active Problems:   Essential  hypertension   HLD (hyperlipidemia)   GERD (gastroesophageal reflux disease)   Dark stools   Fall   Sepsis (HCC)   Lobar pneumonia in the setting of COPD and sepsis: Patient admits critical for sepsis with leukocytosis, tachycardia and tachypnea.  Lactic acid is 2.0.  Hemodynamically stable.  - will admit to tele bed as inpt - IV Rocephin and azithromycin - Mucinex for cough  -Bronchodilators - Urine legionella and S. pneumococcal antigen - Follow up blood  culture x2, sputum culture and respiratory virus panel - will get Procalcitonin and trend lactic acid level [name] sepsis protocol - IVF: 2L of NS bolus in ED, followed by 75 mL [name] hour of NS  HTN:  -Continue home medications: Lisinopril -IV hydralazine prn  HLD: -lipitor  GERD: -Pepcid  Dark stooL; FOBT negative.  Hemoglobin stable at 10.4 -Follow-up for hemoglobin CBC  Fall: Patient has a rib fracture. -PRN Norco for pan -f/u CT-head and neck -Pt/ot  Acute metabolic encephalopathy: Likely due to ongoing infection -Follow-up CT- head and neck -Frequent neuro check    DVT ppx: SCD Code Status: Full code Family Communication: None at bed side.  Disposition Plan:  Anticipate discharge back to previous home environment Consults called:  none Admission status:   Inpatient/tele     Date of Service 10/24/2017    Lorretta Harp [facility] Pager [phone]  If 7PM-7AM, please contact night-coverage [email or web address] Password [email or web address] 10/24/2017, 7:45 PM

## 2017-10-24 NOTE — ED Triage Notes (Signed)
Per EMS, patient from home with family, family reports dark tarry stools with increased weakness and falls x3 days. A&O to baseline. Hx COPD. Denies pain.  BP 120/70 HR 85 RR 28 CBG 250

## 2017-10-25 ENCOUNTER — Other Ambulatory Visit: Payer: Self-pay

## 2017-10-25 DIAGNOSIS — J181 Lobar pneumonia, unspecified organism: Secondary | ICD-10-CM

## 2017-10-25 LAB — BLOOD CULTURE ID PANEL (REFLEXED)
ACINETOBACTER BAUMANNII: NOT DETECTED
Candida albicans: NOT DETECTED
Candida glabrata: NOT DETECTED
Candida krusei: NOT DETECTED
Candida parapsilosis: NOT DETECTED
Candida tropicalis: NOT DETECTED
ENTEROCOCCUS SPECIES: NOT DETECTED
Enterobacter cloacae complex: NOT DETECTED
Enterobacteriaceae species: NOT DETECTED
Escherichia coli: NOT DETECTED
HAEMOPHILUS INFLUENZAE: NOT DETECTED
Klebsiella oxytoca: NOT DETECTED
Klebsiella pneumoniae: NOT DETECTED
LISTERIA MONOCYTOGENES: NOT DETECTED
METHICILLIN RESISTANCE: DETECTED — AB
NEISSERIA MENINGITIDIS: NOT DETECTED
PROTEUS SPECIES: NOT DETECTED
PSEUDOMONAS AERUGINOSA: NOT DETECTED
SERRATIA MARCESCENS: NOT DETECTED
STAPHYLOCOCCUS AUREUS BCID: NOT DETECTED
STAPHYLOCOCCUS SPECIES: DETECTED — AB
STREPTOCOCCUS AGALACTIAE: NOT DETECTED
STREPTOCOCCUS PNEUMONIAE: NOT DETECTED
Streptococcus pyogenes: NOT DETECTED
Streptococcus species: NOT DETECTED

## 2017-10-25 LAB — LACTIC ACID, PLASMA: LACTIC ACID, VENOUS: 0.7 mmol/L (ref 0.5–1.9)

## 2017-10-25 LAB — URINE CULTURE

## 2017-10-25 LAB — STREP PNEUMONIAE URINARY ANTIGEN: Strep Pneumo Urinary Antigen: NEGATIVE

## 2017-10-25 LAB — MAGNESIUM: MAGNESIUM: 1.5 mg/dL — AB (ref 1.7–2.4)

## 2017-10-25 MED ORDER — POTASSIUM CHLORIDE CRYS ER 20 MEQ PO TBCR
40.0000 meq | EXTENDED_RELEASE_TABLET | Freq: Two times a day (BID) | ORAL | Status: AC
  Administered 2017-10-25 (×2): 40 meq via ORAL
  Filled 2017-10-25 (×2): qty 2

## 2017-10-25 MED ORDER — SENNA 8.6 MG PO TABS
2.0000 | ORAL_TABLET | Freq: Once | ORAL | Status: AC
  Administered 2017-10-25: 17.2 mg via ORAL
  Filled 2017-10-25: qty 2

## 2017-10-25 MED ORDER — MAGNESIUM SULFATE 4 GM/100ML IV SOLN
4.0000 g | Freq: Once | INTRAVENOUS | Status: AC
  Administered 2017-10-25: 4 g via INTRAVENOUS
  Filled 2017-10-25: qty 100

## 2017-10-25 NOTE — Plan of Care (Signed)
  Progressing Education: Knowledge of General Education information will improve 10/25/2017 0831 - Progressing by Dineen Conradt, Alben SpittleMary E, RN Health Behavior/Discharge Planning: Ability to manage health-related needs will improve 10/25/2017 0831 - Progressing by Nuriyah Hanline, Alben SpittleMary E, RN Clinical Measurements: Ability to maintain clinical measurements within normal limits will improve 10/25/2017 0831 - Progressing by Hannah Strader, Alben SpittleMary E, RN Will remain free from infection 10/25/2017 0831 - Progressing by Lindsy Cerullo, Alben SpittleMary E, RN Diagnostic test results will improve 10/25/2017 0831 - Progressing by Ewel Lona, Alben SpittleMary E, RN Respiratory complications will improve 10/25/2017 0831 - Progressing by Vale Peraza, Alben SpittleMary E, RN Cardiovascular complication will be avoided 10/25/2017 0831 - Progressing by Bronislaus Verdell, Alben SpittleMary E, RN Activity: Risk for activity intolerance will decrease 10/25/2017 0831 - Progressing by Jameriah Trotti, Alben SpittleMary E, RN Nutrition: Adequate nutrition will be maintained 10/25/2017 0831 - Progressing by Erroll Wilbourne, Alben SpittleMary E, RN Coping: Level of anxiety will decrease 10/25/2017 0831 - Progressing by Morgan Keinath, Alben SpittleMary E, RN Elimination: Will not experience complications related to bowel motility 10/25/2017 0831 - Progressing by Kamaljit Hizer, Alben SpittleMary E, RN Will not experience complications related to urinary retention 10/25/2017 0831 - Progressing by Zachari Alberta, Alben SpittleMary E, RN Pain Managment: General experience of comfort will improve 10/25/2017 0831 - Progressing by Geneva Barrero, Alben SpittleMary E, RN Safety: Ability to remain free from injury will improve 10/25/2017 0831 - Progressing by Kyrstin Campillo, Alben SpittleMary E, RN Skin Integrity: Risk for impaired skin integrity will decrease 10/25/2017 0831 - Progressing by Skiler Tye, Alben SpittleMary E, RN

## 2017-10-25 NOTE — Evaluation (Signed)
Physical Therapy Evaluation Patient Details Name: Adrian Adrian [name] MRN: [id number] DOB: 04-Sep-1935 Today's Date: 10/25/2017   History of Present Illness  82 y.o. male with medical history significant of hypertension, hyperlipidemia, COPD, on 2 L nasal cannula oxygen at home, GERD, depression, dementia. Pt was admitted with Pna, R 10th rib fx after sustaining a fall at home.   Clinical Impression  On eval, pt was Min guard-Min assist for mobility. He walked ~150 feet with use of a rollator; then another 15 feet without an assistive device. Pt remained on Adrian Adrian [name]-[name] dep at baseline. He tolerated activity well. No family present during session. Will follow and progress activity as tolerated. Recommend HHPT f/u.     Follow Up Recommendations Home health PT;Supervision/Assistance - 24 hour    Equipment Recommendations  None recommended by PT    Recommendations for Other Services       Precautions / Restrictions Precautions Precautions: Fall Precaution Comments: [name] dep Required Braces or Orthoses: (on 2 L [name] at home) Restrictions Weight Bearing Restrictions: No      Mobility  Bed Mobility Overal bed mobility: Needs Assistance Bed Mobility: Supine to Sit;Sit to Supine     Supine to sit: Supervision Sit to supine: Supervision   General bed mobility comments: for safety. Increased time.   Transfers Overall transfer level: Needs assistance Equipment used: 4-wheeled walker Transfers: Sit to/from Stand Sit to Stand: Min assist         General transfer comment: Assist to steady. Cues for hand placement. Pt was able to stand, unassisted, from the toilet with use of grabbar.   Ambulation/Gait Ambulation/Gait assistance: Min guard;Min assist Ambulation Distance (Feet): 150 Feet(15'x2, 150'x1) Assistive device: 4-wheeled walker;None Gait Pattern/deviations: Step-through pattern;Decreased stride length     General Gait Details: Min guard assist with rollator use. Min assist to  steady intermittently when walking without an assistive device. Remained on Adrian Adrian [name] [name]. Pt tolerated activity well.   Stairs            Wheelchair Mobility    Modified Rankin (Stroke Patients Only)       Balance Overall balance assessment: Needs assistance;History of Falls Sitting-balance support: Bilateral upper extremity supported;Feet supported Sitting balance-Leahy Scale: Fair Sitting balance - Comments: trunk flexion noted   Standing balance support: No upper extremity supported;Bilateral upper extremity supported;During functional activity Standing balance-Leahy Scale: Poor Standing balance comment: fair static, poor dynamic                             Pertinent Vitals/Pain Pain Assessment: No/denies pain Faces Pain Scale: Hurts little more Pain Location: back Pain Descriptors / Indicators: Aching Pain Intervention(s): Monitored during session;Repositioned    Home Living Family/patient expects to be discharged to:: Private residence Living Arrangements: Non-relatives/Friends;Children Available Help at Discharge: Family           Home Equipment: Gilmer Morane - single point Additional Comments: Pt reports his daughter assist him with "getting my legs over the tub" to get into shower. Pt reports daughter helps with bathing as well. Pt reports he mobilizes for basic ADLs (ex. to go to the bathroom). Pt is hoembound. No family present during OT evaluation.     Prior Function Level of Independence: Needs assistance   Gait / Transfers Assistance Needed: reports he has multiple canes at home but does not use them.  ADL's / Homemaking Assistance Needed: reports daughter assist with tub transfer and bathing.  Hand Dominance        Extremity/Trunk Assessment   Upper Extremity Assessment Upper Extremity Assessment: Defer to OT evaluation    Lower Extremity Assessment Lower Extremity Assessment: Generalized weakness    Cervical / Trunk  Assessment Cervical / Trunk Assessment: Kyphotic  Communication   Communication: Other (comment)(hard to understand)  Cognition Arousal/Alertness: Awake/Adrian [name] Behavior During Therapy: WFL for tasks assessed/performed Overall Cognitive Status: History of cognitive impairments - at baseline                                 General Comments: disoriented to time. states he is at [facility] Was able to verbalize that he "wasn't feeling well" as reason for coming to hospital. Decreased safety awareness (leaving IV pole behind when walking, unaware of need to adjust pants to off of heels before ambulating.       General Comments      Exercises     Assessment/Plan    PT Assessment Patient needs continued PT services  PT Problem List Decreased strength;Decreased balance;Decreased mobility;Decreased knowledge of use of DME;Decreased activity tolerance       PT Treatment Interventions DME instruction;Gait training;Functional mobility training;Balance training;Patient/family education;Therapeutic exercise    PT Goals (Current goals can be found in the Care Plan section)  Acute Rehab PT Goals Patient Stated Goal: not stated PT Goal Formulation: With patient Time For Goal Achievement: 11/08/17 Potential to Achieve Goals: Good    Frequency Min 3X/week   Barriers to discharge        Co-evaluation               AM-PAC PT "6 Clicks" Daily Activity  Outcome Measure Difficulty turning over in bed (including adjusting bedclothes, sheets and blankets)?: None Difficulty moving from lying on back to sitting on the side of the bed? : None Difficulty sitting down on and standing up from a chair with arms (e.g., wheelchair, bedside commode, etc,.)?: None Help needed moving to and from a bed to chair (including a wheelchair)?: A Little Help needed walking in hospital room?: A Little Help needed climbing 3-5 steps with a railing? : A Little 6 Click Score: 21     End of Session Equipment Utilized During Treatment: Oxygen;Gait belt Activity Tolerance: Patient tolerated treatment well Patient left: in bed;with call bell/phone within reach;with bed alarm set   PT Visit Diagnosis: Muscle weakness (generalized) (M62.81);Difficulty in walking, not elsewhere classified (R26.2)    Time: [id number] PT Time Calculation (min) (ACUTE ONLY): 22 min   Charges:   PT Evaluation $PT Eval Moderate Complexity: 1 Mod     PT G Codes:          Adrian AlertJannie Arpita Adrian [name], MPT Pager: [phone]

## 2017-10-25 NOTE — Plan of Care (Signed)
Pt reports no pain 

## 2017-10-25 NOTE — Progress Notes (Signed)
PHARMACY - PHYSICIAN COMMUNICATION CRITICAL VALUE ALERT - BLOOD CULTURE IDENTIFICATION (BCID)  Adrian [name] is an 82 y.o. male who [facility] with a chief complaint of SHOB. Treated as CAP with Rocephin/Azithromycin  Assessment:  Day 2 Rocephin/Azithromycin IV. Lactic acid decreased, Strep pneumo Ag: neg, Flu PCR: neg/neg, Legionella pending  Name of physician (or Provider) Contacted: Jerelene ReddenElizabeth Mathews  Current antibiotics: Rocephin 1gm q24, Azithromycin 500mg  q24  Changes to prescribed antibiotics recommended:  Recommendations accepted by provider : considered contaminant, no change antibiotics  Results for orders placed or performed during the hospital encounter of 10/24/17  Blood Culture ID Panel (Reflexed) (Collected: 10/24/2017  3:43 PM)  Result Value Ref Range   Enterococcus species NOT DETECTED NOT DETECTED   Listeria monocytogenes NOT DETECTED NOT DETECTED   Staphylococcus species DETECTED (A) NOT DETECTED   Staphylococcus aureus NOT DETECTED NOT DETECTED   Methicillin resistance DETECTED (A) NOT DETECTED   Streptococcus species NOT DETECTED NOT DETECTED   Streptococcus agalactiae NOT DETECTED NOT DETECTED   Streptococcus pneumoniae NOT DETECTED NOT DETECTED   Streptococcus pyogenes NOT DETECTED NOT DETECTED   Acinetobacter baumannii NOT DETECTED NOT DETECTED   Enterobacteriaceae species NOT DETECTED NOT DETECTED   Enterobacter cloacae complex NOT DETECTED NOT DETECTED   Escherichia coli NOT DETECTED NOT DETECTED   Klebsiella oxytoca NOT DETECTED NOT DETECTED   Klebsiella pneumoniae NOT DETECTED NOT DETECTED   Proteus species NOT DETECTED NOT DETECTED   Serratia marcescens NOT DETECTED NOT DETECTED   Haemophilus influenzae NOT DETECTED NOT DETECTED   Neisseria meningitidis NOT DETECTED NOT DETECTED   Pseudomonas aeruginosa NOT DETECTED NOT DETECTED   Candida albicans NOT DETECTED NOT DETECTED   Candida glabrata NOT DETECTED NOT DETECTED   Candida krusei NOT DETECTED NOT DETECTED   Candida parapsilosis NOT DETECTED NOT DETECTED   Candida tropicalis NOT DETECTED NOT DETECTED    Chilton SiGreen, Json Koelzer L 10/25/2017  1:29 PM

## 2017-10-25 NOTE — Evaluation (Signed)
Occupational Therapy Evaluation Patient Details Name: Adrian ActonDewey [name] MRN: [id number] DOB: Aug 02, 1935 Today's Date: 10/25/2017    History of Present Illness 82 y.o. male with medical history significant of hypertension, hyperlipidemia, COPD, on 2 L nasal cannula oxygen at home, GERD, depression, dementia, who presents with confusion, cough, shortness breath, tarry stool.   Clinical Impression   Pt admitted with the above diagnoses and presents with below problem list. Pt will benefit from acute continued OT to address the below listed deficits and maximize independence with basic ADLs prior to d/c to venue below. PTA pt reports needing assist for tub transfer and bathing. No family present during eval. Pt reports being home bound and usually OOB for basic ADLs only. Pt noted to have decreased safety awareness during eval. Pt currently min - mod A with ADLs and functional transfers/mobility. Pt likely currently needing a little more assist with ADLs but not too far from his baseline function.      Follow Up Recommendations  Supervision/Assistance - 24 hour;Home health OT    Equipment Recommendations  Tub/shower bench;3 in 1 bedside commode    Recommendations for Other Services PT consult     Precautions / Restrictions Precautions Precautions: Fall Required Braces or Orthoses: (on 2 L O2 at home) Restrictions Weight Bearing Restrictions: No      Mobility Bed Mobility Overal bed mobility: Needs Assistance Bed Mobility: Supine to Sit     Supine to sit: HOB elevated;Min assist     General bed mobility comments: assist to power up trunk  Transfers Overall transfer level: Needs assistance Equipment used: None;Rolling walker (2 wheeled) Transfers: Sit to/from Stand Sit to Stand: Min guard;Min assist         General transfer comment: first without AD then used rw. min A to steady. cues for hand placement.     Balance Overall balance assessment: Needs  assistance Sitting-balance support: Bilateral upper extremity supported;Feet supported Sitting balance-Leahy Scale: Fair Sitting balance - Comments: trunk flexion noted   Standing balance support: No upper extremity supported;Bilateral upper extremity supported;During functional activity Standing balance-Leahy Scale: Poor Standing balance comment: fair static, poor dynamic                           ADL either performed or assessed with clinical judgement   ADL Overall ADL's : Needs assistance/impaired Eating/Feeding: Set up;Sitting   Grooming: Minimal assistance;Sitting   Upper Body Bathing: Moderate assistance;Sitting   Lower Body Bathing: Moderate assistance;Sit to/from stand   Upper Body Dressing : Minimal assistance;Sitting   Lower Body Dressing: Sit to/from stand;Minimal assistance   Toilet Transfer: Minimal assistance;Ambulation;RW   Toileting- Clothing Manipulation and Hygiene: Moderate assistance;Sit to/from stand Toileting - Clothing Manipulation Details (indicate cue type and reason): Pt stood with rw while therapist completed pericare. Tub/ Shower Transfer: Tub transfer;Moderate assistance;Ambulation;Tub bench;Rolling walker   Functional mobility during ADLs: Minimal assistance;Rolling walker General ADL Comments: Pt completed bed mobility, in-room functional mobility, toilet transfer and pericare as detailed above.      Vision         Perception     Praxis      Pertinent Vitals/Pain Pain Assessment: Faces Faces Pain Scale: Hurts little more Pain Location: back Pain Descriptors / Indicators: Aching Pain Intervention(s): Monitored during session;Repositioned     Hand Dominance     Extremity/Trunk Assessment Upper Extremity Assessment Upper Extremity Assessment: Generalized weakness   Lower Extremity Assessment Lower Extremity Assessment: Defer to PT evaluation;Generalized weakness  Communication Communication Communication:  Other (comment)(low volume)   Cognition Arousal/Alertness: Awake/alert(initially lethargic but more alert once EOB) Behavior During Therapy: WFL for tasks assessed/performed Overall Cognitive Status: No family/caregiver present to determine baseline cognitive functioning                                 General Comments: disoriented to time. states [facility] is [facility] Was able to verbalize that he "wasn't feeling well" as reason for coming to hospital. Decreased safety awareness (leaving IV pole behind when walking, unaware of need to adjust pants to off of heels before ambulating.    General Comments       Exercises     Shoulder Instructions      Home Living Family/patient expects to be discharged to:: Private residence Living Arrangements: Non-relatives/Friends;Children Available Help at Discharge: Family               Bathroom Shower/Tub: Tub/shower unit   Bathroom Toilet: Standard     Home Equipment: Cane - single point;Crutches   Additional Comments: Pt reports his daughter assist him with "getting my legs over the tub" to get into shower. Pt reports daughter helps with bathing as well. Pt reports he mobilizes for basic ADLs (ex. to go to the bathroom). Pt is hoembound. No family present during OT evaluation.       Prior Functioning/Environment Level of Independence: Needs assistance  Gait / Transfers Assistance Needed: reports he has multiple canes and crutched at home but does not use them. Home bound. ADL's / Homemaking Assistance Needed: reports daughter assist with tub transfer and bathing.             OT Problem List: Decreased strength;Decreased activity tolerance;Impaired balance (sitting and/or standing);Decreased safety awareness;Decreased knowledge of use of DME or AE;Decreased knowledge of precautions;Pain      OT Treatment/Interventions:      OT Goals(Current goals can be found in the care plan section) Acute Rehab OT  Goals Patient Stated Goal: not stated OT Goal Formulation: With patient Time For Goal Achievement: 11/08/17 Potential to Achieve Goals: Good ADL Goals Pt Will Perform Grooming: with min guard assist;standing;sitting;with set-up Pt Will Perform Upper Body Dressing: with set-up;sitting Pt Will Perform Lower Body Dressing: with min guard assist;sit to/from stand;with adaptive equipment Pt Will Transfer to Toilet: with min guard assist;ambulating Pt Will Perform Toileting - Clothing Manipulation and hygiene: with min guard assist;sit to/from stand Pt Will Perform Tub/Shower Transfer: Tub transfer;with min guard assist;ambulating;tub bench;with min assist;rolling walker;shower seat  OT Frequency: Min 2X/week   Barriers to D/C:            Co-evaluation              AM-PAC PT "6 Clicks" Daily Activity     Outcome Measure Help from another person eating meals?: None Help from another person taking care of personal grooming?: A Little Help from another person toileting, which includes using toliet, bedpan, or urinal?: A Lot Help from another person bathing (including washing, rinsing, drying)?: A Lot Help from another person to put on and taking off regular upper body clothing?: A Little Help from another person to put on and taking off regular lower body clothing?: A Lot 6 Click Score: 16   End of Session Equipment Utilized During Treatment: Gait belt;Rolling walker;Oxygen  Activity Tolerance: Patient limited by lethargy;Patient tolerated treatment well Patient left: Other (comment)(on BSC with NT present )  OT Visit Diagnosis: Unsteadiness on feet (R26.81);Muscle weakness (generalized) (M62.81);Pain                Time: 5784-6962 OT Time Calculation (min): 31 min Charges:  OT General Charges $OT Visit: 1 Visit OT Evaluation $OT Eval Low Complexity: 1 Low OT Treatments $Self Care/Home Management : 8-22 mins G-Codes:       Pilar Grammes 10/25/2017, 1:19 PM

## 2017-10-25 NOTE — Progress Notes (Addendum)
PROGRESS NOTE    Adrian [name]  ZOX:[id number] DOB: 12-Jun-1935 DOA: 10/24/2017 PCP: Patient, No Pcp Per Brief Narrative:82 y.o. male with medical history significant of hypertension, hyperlipidemia, COPD, on 2 L nasal cannula oxygen at home, GERD, depression, dementia, who presents with confusion, cough, shortness breath, tarry stool.  Patient states that he has been having cough, shortness of breath recently, which has worsened since yesterday.  She coughs up white mucus, fever or chills.  Patient states that he had fall caused injury to bilateral lower rib cage.  He has moderate pain in lower rib cages bilaterally.  Patient reports that she noted tarry and dark stool in the past 3 days.  He denies nausea, vomiting, diarrhea or abdominal pain.  He does not have chest pain, but reports chest wall pain.  He moves all extremities normally.  Patient is confused, disoriented to place, but not to time and person.  ED Course: pt was found to have WBC 16.9, urinalysis with small amount of leukocyte, negative FOBT, potassium 3.2, creatinine 1.15, temperature 95.1, tachycardia, tachypnea, oxygen saturation 85% on room air.  X-ray of ribs/chest showed possible right 10th rib fracture and possible left lower field infiltration.  Patient is admitted to telemetry bed as inpatient.    Assessment & Plan:   Principal Problem:   Lobar pneumonia (HCC) Active Problems:   Essential hypertension   HLD (hyperlipidemia)   GERD (gastroesophageal reflux disease)   Dark stools   Fall   Sepsis (HCC)   Acute metabolic encephalopathy  Lobar pneumonia in the setting of COPD and sepsis: Patient admits critical for sepsis with leukocytosis, tachycardia and tachypnea.  Lactic acid is 2.0.  Hemodynamically stable.  - will admit to tele bed as inpt - IV Rocephin and azithromycin - Mucinex for cough  -Bronchodilators  S. pneumococcal antigen negative - Follow up blood culture x2, sputum culture and respiratory  virus panel -Lactic acid level has come down to 0.7 from 2.0 at the time of admission.  Pro-calcitonin 0.29. - IVF: 2L of NS bolus in ED, followed by 75 mL per hour of NS  Hypomagnesemia-replete.  HTN:  -Continue home medications: Lisinopril -IV hydralazine prn  HLD: -lipitor  GERD: -Pepcid  Dark stooL; FOBT negative.  Hemoglobin stable at 10.4 -Follow-up for hemoglobin CBC  Fall: Patient has a rib fracture. -PRN Norco for pan -f/u CT-head and neck -Pt/ot  Acute metabolic encephalopathy: Likely due to ongoing infection - CT- head and neck-NO ACUTE CHANGES. -Frequent neuro check      DVT prophylaxis:  scd Code Statusfull Family Communication no family available Disposition Plan: tbd Consultants: none  Procedures: None Antimicrobials Rocephin and azithromycin Subjective: Denies any specific complaints  Objective: Resting in bed in no acute distress Vitals:   10/25/17 0321 10/25/17 0359 10/25/17 0637 10/25/17 0952  BP: 130/68  124/63   Pulse: 70  79   Resp: 18  20   Temp: 98.4 F (36.9 C)  99.5 F (37.5 C)   TempSrc: Oral  Oral   SpO2: 98%   97%  Weight:      Height:  5\' 5"  (1.651 m)      Intake/Output Summary (Last 24 hours) at 10/25/2017 1158 Last data filed at 10/25/2017 0600 Gross per 24 hour  Intake 1162.5 ml  Output -  Net 1162.5 ml   Filed Weights   10/24/17 1656  Weight: 50.3 kg (111 lb)    Examination:  General exam: Appears calm and comfortable  Respiratory system: Clear to auscultation.  Respiratory effort normal. Cardiovascular system: S1 & S2 heard, RRR. No JVD, murmurs, rubs, gallops or clicks. No pedal edema. Gastrointestinal system: Abdomen is nondistended, soft and nontender. No organomegaly or masses felt. Normal bowel sounds heard. Central nervous system: Alert and oriented. No focal neurological deficits. Extremities: Symmetric 5 x 5 power. Skin: No rashes, lesions or ulcers Psychiatry: Judgement and insight appear  normal. Mood & affect appropriate.     Data Reviewed: I have personally reviewed following labs and imaging studies  CBC: Recent Labs  Lab 10/24/17 1300  WBC 16.9*  NEUTROABS 13.2*  HGB 10.4*  HCT 31.5*  MCV 104.3*  PLT 334   Basic Metabolic Panel: Recent Labs  Lab 10/24/17 1300 10/25/17 0641  NA 142  --   K 3.2*  --   CL 103  --   CO2 29  --   GLUCOSE 146*  --   BUN 15  --   CREATININE 1.15  --   CALCIUM 8.8*  --   MG  --  1.5*   GFR: Estimated Creatinine Clearance: 36.4 mL/min (by C-G formula based on SCr of 1.15 mg/dL). Liver Function Tests: Recent Labs  Lab 10/24/17 1300  AST 34  ALT 29  ALKPHOS 76  BILITOT 0.8  PROT 7.5  ALBUMIN 3.1*   Recent Labs  Lab 10/24/17 1300  LIPASE 26   No results for input(s): AMMONIA in the last 168 hours. Coagulation Profile: Recent Labs  Lab 10/24/17 1900  INR 1.04   Cardiac Enzymes: No results for input(s): CKTOTAL, CKMB, CKMBINDEX, TROPONINI in the last 168 hours. BNP (last 3 results) No results for input(s): PROBNP in the last 8760 hours. HbA1C: No results for input(s): HGBA1C in the last 72 hours. CBG: No results for input(s): GLUCAP in the last 168 hours. Lipid Profile: No results for input(s): CHOL, HDL, LDLCALC, TRIG, CHOLHDL, LDLDIRECT in the last 72 hours. Thyroid Function Tests: No results for input(s): TSH, T4TOTAL, FREET4, T3FREE, THYROIDAB in the last 72 hours. Anemia Panel: No results for input(s): VITAMINB12, FOLATE, FERRITIN, TIBC, IRON, RETICCTPCT in the last 72 hours. Sepsis Labs: Recent Labs  Lab 10/24/17 1308 10/24/17 1555 10/24/17 1900 10/25/17 0641  PROCALCITON  --   --  0.29  --   LATICACIDVEN 2.00* 1.15 1.0 0.7    No results found for this or any previous visit (from the past 240 hour(s)).       Radiology Studies: Dg Ribs Unilateral W/chest Right  Result Date: 10/24/2017 CLINICAL DATA:  Increase weakness and falls and dark tarry stools for the past 3 days. The patient  is complaining of pain over the right lower lateral ribcage. EXAM: RIGHT RIBS AND CHEST - 3+ VIEW COMPARISON:  None in PACs FINDINGS: The lungs are mildly hyperinflated. There is hazy increased density at the left lung base worrisome for pneumonia. There is no pleural effusion or pneumothorax. The heart and pulmonary vascularity are normal. There is calcification in the wall of the aortic arch. Right rib detail radiographs include a metallic BB over the symptomatic lower lateral right ribs. There is subtle contour deformity of the anterior lateral aspect of the right tenth rib. IMPRESSION: Probable nondisplaced fracture of the anterior-lateral aspect of the right tenth rib. COPD. Increased density at the left lung base worrisome for pneumonia. Followup PA and lateral chest X-ray is recommended in 3-4 weeks following trial of antibiotic therapy to ensure resolution and exclude underlying malignancy. Thoracic aortic atherosclerosis. Electronically Signed   By: David  [place] M.D.  On: 10/24/2017 13:46   Ct Head Wo Contrast  Result Date: 10/24/2017 CLINICAL DATA:  82 year old male with history of weakness and falling EXAM: CT HEAD WITHOUT CONTRAST CT CERVICAL SPINE WITHOUT CONTRAST TECHNIQUE: Multidetector CT imaging of the head and cervical spine was performed following the standard protocol without intravenous contrast. Multiplanar CT image reconstructions of the cervical spine were also generated. COMPARISON:  None. FINDINGS: CT HEAD FINDINGS Brain: No acute hemorrhage. No midline shift or mass effect. Unremarkable configuration of the ventricles. Gray-white differentiation maintained. Mild brain volume loss. Vascular: Calcifications of the anterior circulation Skull: No acute fracture.  No aggressive bony lesions. Sinuses/Orbits: Unremarkable orbits. Unremarkable paranasal sinuses without significant paranasal sinus disease. Other: None CT CERVICAL SPINE FINDINGS Alignment: Alignment maintained. No  subluxation. Cranial cervical junction maintained Skull base and vertebrae: Craniocervical junction maintained. No skullbase fracture. No acute fracture of the vertebral bodies. Facet alignment maintained. Vertebral body heights maintained. Soft tissues and spinal canal: No prevertebral soft tissue swelling. No canal hematoma Disc levels: C2-C3: Disc space narrowing with uncovertebral joint disease and no significant foraminal narrowing. Facet disease present. C3-C4: Degenerative disc disease with bilateral uncovertebral joint disease and facet disease contributes to mild bilateral foraminal narrowing. C4-C5: Degenerative disc disease with posterior disc osteophyte complex and bilateral uncovertebral joint disease contributes to left greater than right foraminal narrowing. C5-C6: Disc space narrowing with bilateral uncovertebral joint disease contributes to left greater than right foraminal narrowing. C6-C7: Disc disease with posterior disc osteophyte complex and no significant canal narrowing or foraminal narrowing. C7-T1: No significant canal narrowing or foraminal narrowing. Upper chest: Emphysema of the lung apices. Pleuroparenchymal scarring. Other: Carotid and vertebral artery calcifications. IMPRESSION: Head CT: Head CT negative for acute intracranial abnormality. Mild senescent brain volume loss. Intracranial atherosclerosis. Cervical CT: No CT evidence of acute fracture malalignment of the cervical spine. Calcifications of the bilateral carotid arteries and vertebral arteries. Multilevel degenerative disc disease with associated uncovertebral joint disease contributing to left-greater-than-right foraminal narrowing at C4-C5 and C5-C6. Emphysema Electronically Signed   By: Gilmer MorJaime  Wagner D.O.   On: 10/24/2017 18:56   Ct Cervical Spine Wo Contrast  Result Date: 10/24/2017 CLINICAL DATA:  82 year old male with history of weakness and falling EXAM: CT HEAD WITHOUT CONTRAST CT CERVICAL SPINE WITHOUT CONTRAST  TECHNIQUE: Multidetector CT imaging of the head and cervical spine was performed following the standard protocol without intravenous contrast. Multiplanar CT image reconstructions of the cervical spine were also generated. COMPARISON:  None. FINDINGS: CT HEAD FINDINGS Brain: No acute hemorrhage. No midline shift or mass effect. Unremarkable configuration of the ventricles. Gray-white differentiation maintained. Mild brain volume loss. Vascular: Calcifications of the anterior circulation Skull: No acute fracture.  No aggressive bony lesions. Sinuses/Orbits: Unremarkable orbits. Unremarkable paranasal sinuses without significant paranasal sinus disease. Other: None CT CERVICAL SPINE FINDINGS Alignment: Alignment maintained. No subluxation. Cranial cervical junction maintained Skull base and vertebrae: Craniocervical junction maintained. No skullbase fracture. No acute fracture of the vertebral bodies. Facet alignment maintained. Vertebral body heights maintained. Soft tissues and spinal canal: No prevertebral soft tissue swelling. No canal hematoma Disc levels: C2-C3: Disc space narrowing with uncovertebral joint disease and no significant foraminal narrowing. Facet disease present. C3-C4: Degenerative disc disease with bilateral uncovertebral joint disease and facet disease contributes to mild bilateral foraminal narrowing. C4-C5: Degenerative disc disease with posterior disc osteophyte complex and bilateral uncovertebral joint disease contributes to left greater than right foraminal narrowing. C5-C6: Disc space narrowing with bilateral uncovertebral joint disease contributes to left greater than  right foraminal narrowing. C6-C7: Disc disease with posterior disc osteophyte complex and no significant canal narrowing or foraminal narrowing. C7-T1: No significant canal narrowing or foraminal narrowing. Upper chest: Emphysema of the lung apices. Pleuroparenchymal scarring. Other: Carotid and vertebral artery  calcifications. IMPRESSION: Head CT: Head CT negative for acute intracranial abnormality. Mild senescent brain volume loss. Intracranial atherosclerosis. Cervical CT: No CT evidence of acute fracture malalignment of the cervical spine. Calcifications of the bilateral carotid arteries and vertebral arteries. Multilevel degenerative disc disease with associated uncovertebral joint disease contributing to left-greater-than-right foraminal narrowing at C4-C5 and C5-C6. Emphysema Electronically Signed   By: Gilmer MorJaime  Wagner D.O.   On: 10/24/2017 18:56        Scheduled Meds: . atorvastatin  20 mg Oral QHS  . busPIRone  10 mg Oral BID  . cyanocobalamin  1,000 mcg Intramuscular Q30 days  . DULoxetine  60 mg Oral QPM  . famotidine  20 mg Oral QHS  . fluticasone  1 spray Each Nare Daily  . fluticasone furoate-vilanterol  1 puff Inhalation Daily  . gabapentin  400 mg Oral TID  . ipratropium-albuterol  3 mL Nebulization BID  . lisinopril  40 mg Oral q morning - 10a  . loratadine  10 mg Oral Daily  . primidone  50 mg Oral BID  . traZODone  50 mg Oral QHS   Continuous Infusions: . sodium chloride 75 mL/hr at 10/25/17 0350  . azithromycin    . cefTRIAXone (ROCEPHIN)  IV Stopped (10/25/17 1142)     LOS: 1 day      Alwyn RenElizabeth G Raeanne Deschler, MD [facility]  If 7PM-7AM, please contact night-coverage [email or web address] Password [email or web address] 10/25/2017, 11:58 AM

## 2017-10-26 LAB — BASIC METABOLIC PANEL
Anion gap: 5 (ref 5–15)
BUN: 10 mg/dL (ref 6–20)
CHLORIDE: 108 mmol/L (ref 101–111)
CO2: 26 mmol/L (ref 22–32)
CREATININE: 0.82 mg/dL (ref 0.61–1.24)
Calcium: 7.6 mg/dL — ABNORMAL LOW (ref 8.9–10.3)
GFR calc Af Amer: >60 mL/min (ref >60)
GFR calc non Af Amer: >60 mL/min (ref >60)
GLUCOSE: 104 mg/dL — AB (ref 65–99)
POTASSIUM: 4.6 mmol/L (ref 3.5–5.1)
SODIUM: 139 mmol/L (ref 135–145)

## 2017-10-26 LAB — CBC
HCT: 24.4 % — ABNORMAL LOW (ref 39.0–52.0)
HEMOGLOBIN: 7.9 g/dL — AB (ref 13.0–17.0)
MCH: 34.2 pg — AB (ref 26.0–34.0)
MCHC: 32.4 g/dL (ref 30.0–36.0)
MCV: 105.6 fL — AB (ref 78.0–100.0)
Platelets: 382 10*3/uL (ref 150–400)
RBC: 2.31 MIL/uL — ABNORMAL LOW (ref 4.22–5.81)
RDW: 15.3 % (ref 11.5–15.5)
WBC: 11.1 10*3/uL — ABNORMAL HIGH (ref 4.0–10.5)

## 2017-10-26 LAB — LEGIONELLA PNEUMOPHILA SEROGP 1 UR AG: L. pneumophila Serogp 1 Ur Ag: NEGATIVE

## 2017-10-26 MED ORDER — CEFUROXIME AXETIL 500 MG PO TABS: 500.0000 mg | ORAL_TABLET | Freq: Two times a day (BID) | ORAL | 0 refills | Status: AC

## 2017-10-26 MED ORDER — AZITHROMYCIN 250 MG PO TABS: 500.0000 mg | ORAL_TABLET | Freq: Every day | ORAL | Status: DC

## 2017-10-26 MED ORDER — GABAPENTIN 400 MG PO CAPS: 400.0000 mg | ORAL_CAPSULE | Freq: Three times a day (TID) | ORAL | 0 refills | Status: AC

## 2017-10-26 MED ORDER — DM-GUAIFENESIN ER 30-600 MG PO TB12: 1.0000 | ORAL_TABLET | Freq: Two times a day (BID) | ORAL | Status: AC | PRN

## 2017-10-26 MED ORDER — AZITHROMYCIN 250 MG PO TABS: ORAL_TABLET | ORAL | 0 refills | Status: AC

## 2017-10-26 MED ORDER — CEFUROXIME AXETIL 500 MG PO TABS
500.0000 mg | ORAL_TABLET | Freq: Two times a day (BID) | ORAL | Status: DC
  Filled 2017-10-26: qty 1

## 2017-10-26 NOTE — Discharge Summary (Signed)
Physician Discharge Summary  Adrian [name] ZOX:[id number] DOB: 1935-04-23 DOA: 10/24/2017  PCP: Patient, No Pcp Per  Admit date: 10/24/2017 Discharge date: 10/26/2017  Admitted From: HOME Disposition: HOME Recommendations for Outpatient Follow-up:  1. Follow up with PCP in 1-2 weeks 2. Please obtain BMP/CBC in one week  Home Health YES Equipment/Devices:NONE Discharge Condition:STABLE CODE STATUS:FULL Diet recommendation CARDIAC Brief/Interim Summary:82 y.o. male with medical history significant of hypertension, hyperlipidemia, COPD, on 2 L nasal cannula oxygen at home, GERD, depression, dementia, who presents with confusion, cough, shortness breath, tarry stool.  Patient states that he has been having cough, shortness of breath recently, which has worsened since yesterday.  She coughs up white mucus, fever or chills.  Patient states that he had fall caused injury to bilateral lower rib cage.  He has moderate pain in lower rib cages bilaterally.  Patient reports that she noted tarry and dark stool in the past 3 days.  He denies nausea, vomiting, diarrhea or abdominal pain.  He does not have chest pain, but reports chest wall pain.  He moves all extremities normally.  Patient is confused, disoriented to place, but not to time and person.  ED Course: pt was found to have WBC 16.9, urinalysis with small amount of leukocyte, negative FOBT, potassium 3.2, creatinine 1.15, temperature 95.1, tachycardia, tachypnea, oxygen saturation 85% on room air.  X-ray of ribs/chest showed possible right 10th rib fracture and possible left lower field infiltration.  Patient is admitted to telemetry bed as inpatient  12/21-he feels well.requesting to go home. Discharge Diagnoses:  Principal Problem:   Lobar pneumonia (HCC) Active Problems:   Essential hypertension   HLD (hyperlipidemia)   GERD (gastroesophageal reflux disease)   Dark stools   Fall   Sepsis (HCC)   Acute metabolic  encephalopathy  CAP/COPD-he wa treated with iv rocephin and azithro.wbc trending down.he is back to base line afebrile.will dc on ceftin and zithromax.follow up with pcp.  Discharge Instructions   Allergies as of 10/26/2017      Reactions   Oxycodone Other (See Comments)   Other reaction(s): ANOREXIA,SLEEPLESSNES FAMILY MEMBER SAYS HE CAN TOLERATE 5 MG WELL      Medication List    TAKE these medications   atorvastatin 20 MG tablet Commonly known as:  LIPITOR Take 20 mg by mouth daily.   azithromycin 250 MG tablet Commonly known as:  ZITHROMAX TAKE ONE TABLET DAILY FOR  2DAYS Start taking on:  10/27/2017   BREO ELLIPTA 100-25 MCG/INH Aepb Generic drug:  fluticasone furoate-vilanterol Inhale 1 puff into the lungs daily.   busPIRone 10 MG tablet Commonly known as:  BUSPAR Take 10 mg by mouth 2 (two) times daily as needed for anxiety.   cetirizine 10 MG tablet Commonly known as:  ZYRTEC Take 10 mg by mouth daily.   cyanocobalamin 1000 MCG/ML injection Commonly known as:  (VITAMIN B-12) Inject 1,000 mcg into the muscle every 30 (thirty) days.   dextromethorphan-guaiFENesin 30-600 MG 12hr tablet Commonly known as:  MUCINEX DM Take 1 tablet by mouth 2 (two) times daily as needed for cough.   DULoxetine 60 MG capsule Commonly known as:  CYMBALTA Take 60 mg by mouth every evening.   gabapentin 100 MG capsule Commonly known as:  NEURONTIN Take 100 mg by mouth. FOLLOW TAPER SCHEDULE   gabapentin 400 MG capsule Commonly known as:  NEURONTIN Take 400 mg by mouth. FOLLOW TAPER SCHEDULE   gabapentin 600 MG tablet Commonly known as:  NEURONTIN Take 600 mg by mouth  every 8 (eight) hours.   HYDROcodone-acetaminophen 5-325 MG tablet Commonly known as:  NORCO/VICODIN Take 1 tablet by mouth every 8 (eight) hours as needed for pain.   ipratropium 0.03 % nasal spray Commonly known as:  ATROVENT   lisinopril 40 MG tablet Commonly known as:  PRINIVIL,ZESTRIL Take 40 mg  by mouth every morning.   mometasone 50 MCG/ACT nasal spray Commonly known as:  NASONEX SHAKE LQ AND U 1 SPR IEN D   primidone 50 MG tablet Commonly known as:  MYSOLINE Take 50 mg by mouth 2 (two) times daily.   PROAIR HFA 108 (90 Base) MCG/ACT inhaler Generic drug:  albuterol Inhale 2 puffs into the lungs every 4 (four) hours as needed for wheezing.   ranitidine 150 MG tablet Commonly known as:  ZANTAC Take 150 mg by mouth 4 (four) times daily.   tiotropium 18 MCG inhalation capsule Commonly known as:  SPIRIVA Place 1 capsule into inhaler and inhale.   traZODone 50 MG tablet Commonly known as:  DESYREL Take 50 mg by mouth. 30 MINUTES BEFORE BED FOR INSOMNIA       Allergies  Allergen Reactions  . Oxycodone Other (See Comments)    Other reaction(s): ANOREXIA,SLEEPLESSNES FAMILY MEMBER SAYS HE CAN TOLERATE 5 MG WELL    Consultations   Procedures/Studies: Dg Ribs Unilateral W/chest Right  Result Date: 10/24/2017 CLINICAL DATA:  Increase weakness and falls and dark tarry stools for the past 3 days. The patient is complaining of pain over the right lower lateral ribcage. EXAM: RIGHT RIBS AND CHEST - 3+ VIEW COMPARISON:  None in PACs FINDINGS: The lungs are mildly hyperinflated. There is hazy increased density at the left lung base worrisome for pneumonia. There is no pleural effusion or pneumothorax. The heart and pulmonary vascularity are normal. There is calcification in the wall of the aortic arch. Right rib detail radiographs include a metallic BB over the symptomatic lower lateral right ribs. There is subtle contour deformity of the anterior lateral aspect of the right tenth rib. IMPRESSION: Probable nondisplaced fracture of the anterior-lateral aspect of the right tenth rib. COPD. Increased density at the left lung base worrisome for pneumonia. Followup PA and lateral chest X-ray is recommended in 3-4 weeks following trial of antibiotic therapy to ensure resolution and  exclude underlying malignancy. Thoracic aortic atherosclerosis. Electronically Signed   By: David  [place] M.D.   On: 10/24/2017 13:46   Ct Head Wo Contrast  Result Date: 10/24/2017 CLINICAL DATA:  82 year old male with history of weakness and falling EXAM: CT HEAD WITHOUT CONTRAST CT CERVICAL SPINE WITHOUT CONTRAST TECHNIQUE: Multidetector CT imaging of the head and cervical spine was performed following the standard protocol without intravenous contrast. Multiplanar CT image reconstructions of the cervical spine were also generated. COMPARISON:  None. FINDINGS: CT HEAD FINDINGS Brain: No acute hemorrhage. No midline shift or mass effect. Unremarkable configuration of the ventricles. Gray-white differentiation maintained. Mild brain volume loss. Vascular: Calcifications of the anterior circulation Skull: No acute fracture.  No aggressive bony lesions. Sinuses/Orbits: Unremarkable orbits. Unremarkable paranasal sinuses without significant paranasal sinus disease. Other: None CT CERVICAL SPINE FINDINGS Alignment: Alignment maintained. No subluxation. Cranial cervical junction maintained Skull base and vertebrae: Craniocervical junction maintained. No skullbase fracture. No acute fracture of the vertebral bodies. Facet alignment maintained. Vertebral body heights maintained. Soft tissues and spinal canal: No prevertebral soft tissue swelling. No canal hematoma Disc levels: C2-C3: Disc space narrowing with uncovertebral joint disease and no significant foraminal narrowing. Facet disease present. C3-C4:  Degenerative disc disease with bilateral uncovertebral joint disease and facet disease contributes to mild bilateral foraminal narrowing. C4-C5: Degenerative disc disease with posterior disc osteophyte complex and bilateral uncovertebral joint disease contributes to left greater than right foraminal narrowing. C5-C6: Disc space narrowing with bilateral uncovertebral joint disease contributes to left greater than  right foraminal narrowing. C6-C7: Disc disease with posterior disc osteophyte complex and no significant canal narrowing or foraminal narrowing. C7-T1: No significant canal narrowing or foraminal narrowing. Upper chest: Emphysema of the lung apices. Pleuroparenchymal scarring. Other: Carotid and vertebral artery calcifications. IMPRESSION: Head CT: Head CT negative for acute intracranial abnormality. Mild senescent brain volume loss. Intracranial atherosclerosis. Cervical CT: No CT evidence of acute fracture malalignment of the cervical spine. Calcifications of the bilateral carotid arteries and vertebral arteries. Multilevel degenerative disc disease with associated uncovertebral joint disease contributing to left-greater-than-right foraminal narrowing at C4-C5 and C5-C6. Emphysema Electronically Signed   By: Gilmer Mor D.O.   On: 10/24/2017 18:56   Ct Cervical Spine Wo Contrast  Result Date: 10/24/2017 CLINICAL DATA:  82 year old male with history of weakness and falling EXAM: CT HEAD WITHOUT CONTRAST CT CERVICAL SPINE WITHOUT CONTRAST TECHNIQUE: Multidetector CT imaging of the head and cervical spine was performed following the standard protocol without intravenous contrast. Multiplanar CT image reconstructions of the cervical spine were also generated. COMPARISON:  None. FINDINGS: CT HEAD FINDINGS Brain: No acute hemorrhage. No midline shift or mass effect. Unremarkable configuration of the ventricles. Gray-white differentiation maintained. Mild brain volume loss. Vascular: Calcifications of the anterior circulation Skull: No acute fracture.  No aggressive bony lesions. Sinuses/Orbits: Unremarkable orbits. Unremarkable paranasal sinuses without significant paranasal sinus disease. Other: None CT CERVICAL SPINE FINDINGS Alignment: Alignment maintained. No subluxation. Cranial cervical junction maintained Skull base and vertebrae: Craniocervical junction maintained. No skullbase fracture. No acute fracture  of the vertebral bodies. Facet alignment maintained. Vertebral body heights maintained. Soft tissues and spinal canal: No prevertebral soft tissue swelling. No canal hematoma Disc levels: C2-C3: Disc space narrowing with uncovertebral joint disease and no significant foraminal narrowing. Facet disease present. C3-C4: Degenerative disc disease with bilateral uncovertebral joint disease and facet disease contributes to mild bilateral foraminal narrowing. C4-C5: Degenerative disc disease with posterior disc osteophyte complex and bilateral uncovertebral joint disease contributes to left greater than right foraminal narrowing. C5-C6: Disc space narrowing with bilateral uncovertebral joint disease contributes to left greater than right foraminal narrowing. C6-C7: Disc disease with posterior disc osteophyte complex and no significant canal narrowing or foraminal narrowing. C7-T1: No significant canal narrowing or foraminal narrowing. Upper chest: Emphysema of the lung apices. Pleuroparenchymal scarring. Other: Carotid and vertebral artery calcifications. IMPRESSION: Head CT: Head CT negative for acute intracranial abnormality. Mild senescent brain volume loss. Intracranial atherosclerosis. Cervical CT: No CT evidence of acute fracture malalignment of the cervical spine. Calcifications of the bilateral carotid arteries and vertebral arteries. Multilevel degenerative disc disease with associated uncovertebral joint disease contributing to left-greater-than-right foraminal narrowing at C4-C5 and C5-C6. Emphysema Electronically Signed   By: Gilmer Mor D.O.   On: 10/24/2017 18:56    (Echo, Carotid, EGD, Colonoscopy, ERCP)    Subjective:   Discharge Exam: Vitals:   10/26/17 0526 10/26/17 0929  BP: (!) 118/53   Pulse: 68   Resp: 18   Temp: 98.4 F (36.9 C)   SpO2: 97% 92%   Vitals:   10/25/17 2021 10/25/17 2120 10/26/17 0526 10/26/17 0929  BP: (!) 135/56  (!) 118/53   Pulse: 76  68   Resp:  18  18    Temp: 98.4 F (36.9 C)  98.4 F (36.9 C)   TempSrc: Oral  Oral   SpO2: 98% 97% 97% 92%  Weight:      Height:        General: Pt is alert, awake, not in acute distress Cardiovascular: RRR, S1/S2 +, no rubs, no gallops Respiratory: CTA bilaterally, no wheezing, no rhonchi Abdominal: Soft, NT, ND, bowel sounds + Extremities: no edema, no cyanosis    The results of significant diagnostics from this hospitalization (including imaging, microbiology, ancillary and laboratory) are listed below for reference.     Microbiology: Recent Results (from the past 240 hour(s))  Urine culture     Status: Abnormal   Collection Time: 10/24/17  2:52 PM  Result Value Ref Range Status   Specimen Description URINE, CLEAN CATCH  Final   Special Requests NONE  Final   Culture MULTIPLE SPECIES PRESENT, SUGGEST RECOLLECTION (A)  Final   Report Status 10/25/2017 FINAL  Final  Blood Culture (routine x 2)     Status: None (Preliminary result)   Collection Time: 10/24/17  3:43 PM  Result Value Ref Range Status   Specimen Description BLOOD LEFT ARM  Final   Special Requests   Final    BOTTLES DRAWN AEROBIC AND ANAEROBIC Blood Culture adequate volume   Culture  Setup Time   Final    GRAM POSITIVE COCCI IN BOTH AEROBIC AND ANAEROBIC BOTTLES CRITICAL RESULT CALLED TO, READ BACK BY AND VERIFIED WITH: PHARMD N GLOGOVAC [phone] MLM Performed at [facility], [address] [place], [place] [address]    Culture GRAM POSITIVE COCCI  Final   Report Status PENDING  Incomplete  Blood Culture ID Panel (Reflexed)     Status: Abnormal   Collection Time: 10/24/17  3:43 PM  Result Value Ref Range Status   Enterococcus species NOT DETECTED NOT DETECTED Final   Listeria monocytogenes NOT DETECTED NOT DETECTED Final   Staphylococcus species DETECTED (A) NOT DETECTED Final    Comment: Methicillin (oxacillin) resistant coagulase negative staphylococcus. Possible blood culture contaminant (unless isolated  from more than one blood culture draw or clinical case suggests pathogenicity). No antibiotic treatment is indicated for blood  culture contaminants. CRITICAL RESULT CALLED TO, READ BACK BY AND VERIFIED WITH: PHARMD N GLOGOVAC [phone] MLM    Staphylococcus aureus NOT DETECTED NOT DETECTED Final   Methicillin resistance DETECTED (A) NOT DETECTED Final    Comment: CRITICAL RESULT CALLED TO, READ BACK BY AND VERIFIED WITH: PHARMD N GLOGOVAC [phone] MLM    Streptococcus species NOT DETECTED NOT DETECTED Final   Streptococcus agalactiae NOT DETECTED NOT DETECTED Final   Streptococcus pneumoniae NOT DETECTED NOT DETECTED Final   Streptococcus pyogenes NOT DETECTED NOT DETECTED Final   Acinetobacter baumannii NOT DETECTED NOT DETECTED Final   Enterobacteriaceae species NOT DETECTED NOT DETECTED Final   Enterobacter cloacae complex NOT DETECTED NOT DETECTED Final   Escherichia coli NOT DETECTED NOT DETECTED Final   Klebsiella oxytoca NOT DETECTED NOT DETECTED Final   Klebsiella pneumoniae NOT DETECTED NOT DETECTED Final   Proteus species NOT DETECTED NOT DETECTED Final   Serratia marcescens NOT DETECTED NOT DETECTED Final   Haemophilus influenzae NOT DETECTED NOT DETECTED Final   Neisseria meningitidis NOT DETECTED NOT DETECTED Final   Pseudomonas aeruginosa NOT DETECTED NOT DETECTED Final   Candida albicans NOT DETECTED NOT DETECTED Final   Candida glabrata NOT DETECTED NOT DETECTED Final   Candida krusei  NOT DETECTED NOT DETECTED Final   Candida parapsilosis NOT DETECTED NOT DETECTED Final   Candida tropicalis NOT DETECTED NOT DETECTED Final    Comment: Performed at [facility], [address] [place], [place] [address]  Blood Culture (routine x 2)     Status: None (Preliminary result)   Collection Time: 10/24/17  3:45 PM  Result Value Ref Range Status   Specimen Description BLOOD RIGHT FOREARM  Final   Special Requests   Final    BOTTLES DRAWN AEROBIC AND ANAEROBIC  Blood Culture adequate volume   Culture   Final    NO GROWTH < 24 HOURS Performed at [facility], [address] [place], [place] [address]    Report Status PENDING  Incomplete     Labs: BNP (last 3 results) No results for input(s): BNP in the last 8760 hours. Basic Metabolic Panel: Recent Labs  Lab 10/24/17 1300 10/25/17 0641 10/26/17 0428  NA 142  --  139  K 3.2*  --  4.6  CL 103  --  108  CO2 29  --  26  GLUCOSE 146*  --  104*  BUN 15  --  10  CREATININE 1.15  --  0.82  CALCIUM 8.8*  --  7.6*  MG  --  1.5*  --    Liver Function Tests: Recent Labs  Lab 10/24/17 1300  AST 34  ALT 29  ALKPHOS 76  BILITOT 0.8  PROT 7.5  ALBUMIN 3.1*   Recent Labs  Lab 10/24/17 1300  LIPASE 26   No results for input(s): AMMONIA in the last 168 hours. CBC: Recent Labs  Lab 10/24/17 1300 10/26/17 0428  WBC 16.9* 11.1*  NEUTROABS 13.2*  --   HGB 10.4* 7.9*  HCT 31.5* 24.4*  MCV 104.3* 105.6*  PLT 334 382   Cardiac Enzymes: No results for input(s): CKTOTAL, CKMB, CKMBINDEX, TROPONINI in the last 168 hours. BNP: Invalid input(s): POCBNP CBG: No results for input(s): GLUCAP in the last 168 hours. D-Dimer No results for input(s): DDIMER in the last 72 hours. Hgb A1c No results for input(s): HGBA1C in the last 72 hours. Lipid Profile No results for input(s): CHOL, HDL, LDLCALC, TRIG, CHOLHDL, LDLDIRECT in the last 72 hours. Thyroid function studies No results for input(s): TSH, T4TOTAL, T3FREE, THYROIDAB in the last 72 hours.  Invalid input(s): FREET3 Anemia work up No results for input(s): VITAMINB12, FOLATE, FERRITIN, TIBC, IRON, RETICCTPCT in the last 72 hours. Urinalysis    Component Value Date/Time   COLORURINE AMBER (A) 10/24/2017 1452   APPEARANCEUR HAZY (A) 10/24/2017 1452   LABSPEC 1.026 10/24/2017 1452   PHURINE 5.0 10/24/2017 1452   GLUCOSEU NEGATIVE 10/24/2017 1452   HGBUR NEGATIVE 10/24/2017 1452   BILIRUBINUR SMALL (A) 10/24/2017 1452    KETONESUR 5 (A) 10/24/2017 1452   PROTEINUR 100 (A) 10/24/2017 1452   NITRITE NEGATIVE 10/24/2017 1452   LEUKOCYTESUR SMALL (A) 10/24/2017 1452   Sepsis Labs Invalid input(s): PROCALCITONIN,  WBC,  LACTICIDVEN Microbiology Recent Results (from the past 240 hour(s))  Urine culture     Status: Abnormal   Collection Time: 10/24/17  2:52 PM  Result Value Ref Range Status   Specimen Description URINE, CLEAN CATCH  Final   Special Requests NONE  Final   Culture MULTIPLE SPECIES PRESENT, SUGGEST RECOLLECTION (A)  Final   Report Status 10/25/2017 FINAL  Final  Blood Culture (routine x 2)     Status: None (Preliminary result)   Collection  Time: 10/24/17  3:43 PM  Result Value Ref Range Status   Specimen Description BLOOD LEFT ARM  Final   Special Requests   Final    BOTTLES DRAWN AEROBIC AND ANAEROBIC Blood Culture adequate volume   Culture  Setup Time   Final    GRAM POSITIVE COCCI IN BOTH AEROBIC AND ANAEROBIC BOTTLES CRITICAL RESULT CALLED TO, READ BACK BY AND VERIFIED WITH: PHARMD N GLOGOVAC [phone] MLM Performed at [facility], [address], [place], [place] [address]    Culture GRAM POSITIVE COCCI  Final   Report Status PENDING  Incomplete  Blood Culture ID Panel (Reflexed)     Status: Abnormal   Collection Time: 10/24/17  3:43 PM  Result Value Ref Range Status   Enterococcus species NOT DETECTED NOT DETECTED Final   Listeria monocytogenes NOT DETECTED NOT DETECTED Final   Staphylococcus species DETECTED (A) NOT DETECTED Final    Comment: Methicillin (oxacillin) resistant coagulase negative staphylococcus. Possible blood culture contaminant (unless isolated from more than one blood culture draw or clinical case suggests pathogenicity). No antibiotic treatment is indicated for blood  culture contaminants. CRITICAL RESULT CALLED TO, READ BACK BY AND VERIFIED WITH: PHARMD N GLOGOVAC [phone] MLM    Staphylococcus aureus NOT DETECTED NOT DETECTED Final   Methicillin  resistance DETECTED (A) NOT DETECTED Final    Comment: CRITICAL RESULT CALLED TO, READ BACK BY AND VERIFIED WITH: PHARMD N GLOGOVAC [phone] MLM    Streptococcus species NOT DETECTED NOT DETECTED Final   Streptococcus agalactiae NOT DETECTED NOT DETECTED Final   Streptococcus pneumoniae NOT DETECTED NOT DETECTED Final   Streptococcus pyogenes NOT DETECTED NOT DETECTED Final   Acinetobacter baumannii NOT DETECTED NOT DETECTED Final   Enterobacteriaceae species NOT DETECTED NOT DETECTED Final   Enterobacter cloacae complex NOT DETECTED NOT DETECTED Final   Escherichia coli NOT DETECTED NOT DETECTED Final   Klebsiella oxytoca NOT DETECTED NOT DETECTED Final   Klebsiella pneumoniae NOT DETECTED NOT DETECTED Final   Proteus species NOT DETECTED NOT DETECTED Final   Serratia marcescens NOT DETECTED NOT DETECTED Final   Haemophilus influenzae NOT DETECTED NOT DETECTED Final   Neisseria meningitidis NOT DETECTED NOT DETECTED Final   Pseudomonas aeruginosa NOT DETECTED NOT DETECTED Final   Candida albicans NOT DETECTED NOT DETECTED Final   Candida glabrata NOT DETECTED NOT DETECTED Final   Candida krusei NOT DETECTED NOT DETECTED Final   Candida parapsilosis NOT DETECTED NOT DETECTED Final   Candida tropicalis NOT DETECTED NOT DETECTED Final    Comment: Performed at [facility], [address] [place], [place] [address]  Blood Culture (routine x 2)     Status: None (Preliminary result)   Collection Time: 10/24/17  3:45 PM  Result Value Ref Range Status   Specimen Description BLOOD RIGHT FOREARM  Final   Special Requests   Final    BOTTLES DRAWN AEROBIC AND ANAEROBIC Blood Culture adequate volume   Culture   Final    NO GROWTH < 24 HOURS Performed at [facility], [address] [place], [place] [address]    Report Status PENDING  Incomplete     Time coordinating discharge: Over 30 minutes  SIGNED:   Alwyn Ren, MD  [facility] 10/26/2017, 10:45  AM Pager   If 7PM-7AM, please contact night-coverage [email or web address] Password [email or web address]

## 2017-10-26 NOTE — Plan of Care (Signed)
  Progressing Education: Knowledge of General Education information will improve 10/26/2017 0830 - Progressing by Shontell Prosser, Alben SpittleMary E, RN Health Behavior/Discharge Planning: Ability to manage health-related needs will improve 10/26/2017 0830 - Progressing by Ortencia Askari, Alben SpittleMary E, RN Clinical Measurements: Ability to maintain clinical measurements within normal limits will improve 10/26/2017 0830 - Progressing by Damiah Mcdonald, Alben SpittleMary E, RN Will remain free from infection 10/26/2017 0830 - Progressing by Hopie Pellegrin, Alben SpittleMary E, RN Diagnostic test results will improve 10/26/2017 0830 - Progressing by Valen Mascaro, Alben SpittleMary E, RN Respiratory complications will improve 10/26/2017 0830 - Progressing by Arlin Sass, Alben SpittleMary E, RN Cardiovascular complication will be avoided 10/26/2017 0830 - Progressing by Ahmaad Neidhardt, Alben SpittleMary E, RN Activity: Risk for activity intolerance will decrease 10/26/2017 0830 - Progressing by Abelino Tippin, Alben SpittleMary E, RN Nutrition: Adequate nutrition will be maintained 10/26/2017 0830 - Progressing by Jonanthony Nahar, Alben SpittleMary E, RN Coping: Level of anxiety will decrease 10/26/2017 0830 - Progressing by Shawneen Deetz, Alben SpittleMary E, RN Elimination: Will not experience complications related to bowel motility 10/26/2017 0830 - Progressing by Cherylynn Liszewski, Alben SpittleMary E, RN Will not experience complications related to urinary retention 10/26/2017 0830 - Progressing by Ronika Kelson, Alben SpittleMary E, RN Pain Managment: General experience of comfort will improve 10/26/2017 0830 - Progressing by Kaydyn Sayas, Alben SpittleMary E, RN Safety: Ability to remain free from injury will improve 10/26/2017 0830 - Progressing by Casey Maxfield, Alben SpittleMary E, RN Skin Integrity: Risk for impaired skin integrity will decrease 10/26/2017 0830 - Progressing by Lavene Penagos, Alben SpittleMary E, RN

## 2017-10-26 NOTE — Progress Notes (Signed)
[id number]/Eyvonne Burchfield,BSN,RN3,[phone]/[facility] notified of home health needs.

## 2017-10-26 NOTE — Discharge Instructions (Signed)
FOLLOW UP WITH PCP

## 2017-10-26 NOTE — Progress Notes (Signed)
PHARMACIST - PHYSICIAN COMMUNICATION DR:   Ashley RoyaltyMatthews CONCERNING: Antibiotic IV to Oral Route Change Policy  RECOMMENDATION: This patient is receiving azithromycin by the intravenous route.  Based on criteria approved by the Pharmacy and Therapeutics Committee, the antibiotic(s) is/are being converted to the equivalent oral dose form(s) after today's IV dose.   DESCRIPTION: These criteria include:  Patient being treated for a respiratory tract infection, urinary tract infection, cellulitis or clostridium difficile associated diarrhea if on metronidazole  The patient is not neutropenic and does not exhibit a GI malabsorption state  The patient is eating (either orally or via tube) and/or has been taking other orally administered medications for a least 24 hours  The patient is improving clinically and has a Tmax < 100.5  If you have questions about this conversion, please contact the Pharmacy Department  []   [phone]( [phone] )  [facility] []   [phone]( [phone] )  [facility] []   [phone]( [phone] )  [facility] []   [phone]( [phone] )  [facility] [x]   [phone]( [phone] )  [facility]   Thank you, Loralee PacasErin Shanterica Biehler, PharmD, BCPS Pager: [phone] 10/26/2017 9:25 AM

## 2017-10-26 NOTE — Progress Notes (Signed)
Physical Therapy Treatment Patient Details Name: Adrian [name] MRN: [id number] DOB: 1935-08-26 Today's Date: 10/26/2017    History of Present Illness 82 y.o. male with medical history significant of hypertension, hyperlipidemia, COPD, on 2 L nasal cannula oxygen at home, GERD, depression, dementia. Pt was admitted with Pna, R 10th rib fx after sustaining a fall at home.     PT Comments    Pt participated fairly well. He c/o some back pain, fatigue, and LE weakness on today. Instructed pt to use his rolling walker at home until he returns to baseline. Continue to recommend 24 hour supervision/assist until pt returns to baseline.     Follow Up Recommendations  Home health PT;Supervision/Assistance - 24 hour     Equipment Recommendations  None recommended by PT    Recommendations for Other Services       Precautions / Restrictions Precautions Precautions: Fall Precaution Comments: O2 dep Restrictions Weight Bearing Restrictions: No    Mobility  Bed Mobility Overal bed mobility: Modified Independent                Transfers Overall transfer level: Needs assistance Equipment used: Rolling walker (2 wheeled);None Transfers: Sit to/from Stand Sit to Stand: Min assist         General transfer comment: Assist to steady. Cues for hand placement. Pt was able to stand, unassisted, from the toilet with use of grabbar.   Ambulation/Gait Ambulation/Gait assistance: Min assist Ambulation Distance (Feet): 135 Feet Assistive device: Rolling walker (2 wheeled) Gait Pattern/deviations: Step-through pattern;Decreased stride length     General Gait Details: slow gait speed. Pt c/o some fatigue on today. O2 sat 97% on 2L immediately after walking, dyspnea 2/4.    Stairs            Wheelchair Mobility    Modified Rankin (Stroke Patients Only)       Balance Overall balance assessment: Needs assistance         Standing balance support: Bilateral upper  extremity supported Standing balance-Leahy Scale: Poor Standing balance comment: fair static, poor dynamic                            Cognition Arousal/Alertness: Awake/alert Behavior During Therapy: WFL for tasks assessed/performed Overall Cognitive Status: History of cognitive impairments - at baseline                                        Exercises      General Comments        Pertinent Vitals/Pain Pain Assessment: Faces Faces Pain Scale: Hurts little more Pain Location: back Pain Descriptors / Indicators: Sore Pain Intervention(s): Monitored during session;Repositioned    Home Living                      Prior Function            PT Goals (current goals can now be found in the care plan section) Progress towards PT goals: Progressing toward goals    Frequency    Min 3X/week      PT Plan Current plan remains appropriate    Co-evaluation              AM-PAC PT "6 Clicks" Daily Activity  Outcome Measure  Difficulty turning over in bed (including adjusting bedclothes, sheets and blankets)?: None Difficulty moving from  lying on back to sitting on the side of the bed? : None Difficulty sitting down on and standing up from a chair with arms (e.g., wheelchair, bedside commode, etc,.)?: Unable Help needed moving to and from a bed to chair (including a wheelchair)?: A Little Help needed walking in hospital room?: A Little Help needed climbing 3-5 steps with a railing? : A Little 6 Click Score: 18    End of Session Equipment Utilized During Treatment: Gait belt;Oxygen Activity Tolerance: Patient tolerated treatment well Patient left: in chair;with call bell/phone within reach;with chair alarm set   PT Visit Diagnosis: Muscle weakness (generalized) (M62.81);Difficulty in walking, not elsewhere classified (R26.2)     Time: [id number] PT Time Calculation (min) (ACUTE ONLY): 23 min  Charges:  $Gait Training: 8-22  mins $Therapeutic Activity: 8-22 mins                    G Codes:          Adrian [name], MPT Pager: [phone]

## 2017-10-27 LAB — CULTURE, BLOOD (ROUTINE X 2): SPECIAL REQUESTS: ADEQUATE

## 2017-10-29 LAB — CULTURE, BLOOD (ROUTINE X 2)
Culture: NO GROWTH
Special Requests: ADEQUATE

## 2017-12-25 ENCOUNTER — Ambulatory Visit
Admission: RE | Admit: 2017-12-25 | Discharge: 2017-12-25 | Disposition: A | Discharge status: Home / Self Care | Payer: No Typology Code available for payment source | Source: Ambulatory Visit | Attending: Nurse Practitioner | Admitting: Nurse Practitioner

## 2017-12-25 ENCOUNTER — Other Ambulatory Visit: Payer: Self-pay | Admitting: Nurse Practitioner

## 2017-12-25 DIAGNOSIS — R0602 Shortness of breath: Secondary | ICD-10-CM

## 2018-01-01 ENCOUNTER — Institutional Professional Consult (permissible substitution): Payer: Medicaid Other | Admitting: Pulmonary Disease

## 2018-01-21 ENCOUNTER — Ambulatory Visit
Admission: RE | Admit: 2018-01-21 | Discharge: 2018-01-21 | Disposition: A | Discharge status: Home / Self Care | Payer: Medicaid Other | Source: Ambulatory Visit | Attending: Gerontology | Admitting: Gerontology

## 2018-01-21 ENCOUNTER — Other Ambulatory Visit: Payer: Self-pay | Admitting: Gerontology

## 2018-01-21 DIAGNOSIS — J189 Pneumonia, unspecified organism: Secondary | ICD-10-CM

## 2018-02-04 ENCOUNTER — Institutional Professional Consult (permissible substitution): Payer: Medicaid Other | Admitting: Pulmonary Disease

## 2018-02-08 ENCOUNTER — Institutional Professional Consult (permissible substitution): Payer: Medicaid Other | Admitting: Pulmonary Disease

## 2018-03-16 MED ORDER — HYDROCODONE-ACETAMINOPHEN 5-325 MG PO TABS
1.00 | ORAL_TABLET | ORAL | Status: DC
Start: ? — End: 2018-03-16

## 2018-03-16 MED ORDER — DILTIAZEM HCL ER COATED BEADS 120 MG PO CP24
120.00 | ORAL_CAPSULE | ORAL | Status: DC
Start: 2018-03-17 — End: 2018-03-16

## 2018-03-16 MED ORDER — ALBUTEROL SULFATE (2.5 MG/3ML) 0.083% IN NEBU
2.50 | INHALATION_SOLUTION | RESPIRATORY_TRACT | Status: DC
Start: ? — End: 2018-03-16

## 2018-03-16 MED ORDER — POLYETHYLENE GLYCOL 3350 17 G PO PACK
17.00 g | PACK | ORAL | Status: DC
Start: ? — End: 2018-03-16

## 2018-03-16 MED ORDER — LISINOPRIL 20 MG PO TABS
40.00 | ORAL_TABLET | ORAL | Status: DC
Start: 2018-03-17 — End: 2018-03-16

## 2018-03-16 MED ORDER — IPRATROPIUM BROMIDE 0.02 % IN SOLN
500.00 | RESPIRATORY_TRACT | Status: DC
Start: 2018-03-16 — End: 2018-03-16

## 2018-03-16 MED ORDER — GABAPENTIN 300 MG PO CAPS
600.00 | ORAL_CAPSULE | ORAL | Status: DC
Start: 2018-03-16 — End: 2018-03-16

## 2018-03-16 MED ORDER — ALBUTEROL SULFATE (5 MG/ML) 0.5% IN NEBU
2.50 | INHALATION_SOLUTION | RESPIRATORY_TRACT | Status: DC
Start: 2018-03-16 — End: 2018-03-16

## 2018-03-16 MED ORDER — PRIMIDONE 50 MG PO TABS
50.00 | ORAL_TABLET | ORAL | Status: DC
Start: 2018-03-16 — End: 2018-03-16

## 2018-03-16 MED ORDER — POLYVINYL ALCOHOL 1.4 % OP SOLN
2.00 | OPHTHALMIC | Status: DC
Start: ? — End: 2018-03-16

## 2018-03-16 MED ORDER — ATORVASTATIN CALCIUM 40 MG PO TABS
20.00 | ORAL_TABLET | ORAL | Status: DC
Start: 2018-03-16 — End: 2018-03-16

## 2018-03-16 MED ORDER — FLUTICASONE FUROATE-VILANTEROL 100-25 MCG/INH IN AEPB
1.00 | INHALATION_SPRAY | RESPIRATORY_TRACT | Status: DC
Start: 2018-03-17 — End: 2018-03-16

## 2018-03-16 MED ORDER — ASPIRIN EC 81 MG PO TBEC
81.00 | DELAYED_RELEASE_TABLET | ORAL | Status: DC
Start: 2018-03-17 — End: 2018-03-16

## 2018-03-16 MED ORDER — ACETAMINOPHEN 325 MG PO TABS
650.00 | ORAL_TABLET | ORAL | Status: DC
Start: ? — End: 2018-03-16

## 2018-03-16 MED ORDER — PANTOPRAZOLE SODIUM 40 MG PO TBEC
40.00 | DELAYED_RELEASE_TABLET | ORAL | Status: DC
Start: 2018-03-17 — End: 2018-03-16

## 2018-04-22 MED ORDER — METHYLPREDNISOLONE SODIUM SUCC 40 MG IJ SOLR
40.00 | INTRAMUSCULAR | Status: DC
Start: 2018-04-23 — End: 2018-04-22

## 2018-04-22 MED ORDER — POLYVINYL ALCOHOL 1.4 % OP SOLN
1.00 | OPHTHALMIC | Status: DC
Start: ? — End: 2018-04-22

## 2018-04-22 MED ORDER — BISACODYL 5 MG PO TBEC
10.00 | DELAYED_RELEASE_TABLET | ORAL | Status: DC
Start: ? — End: 2018-04-22

## 2018-04-22 MED ORDER — DOCUSATE SODIUM 100 MG PO CAPS
100.00 | ORAL_CAPSULE | ORAL | Status: DC
Start: ? — End: 2018-04-22

## 2018-04-22 MED ORDER — PRIMIDONE 50 MG PO TABS
50.00 | ORAL_TABLET | ORAL | Status: DC
Start: 2018-04-23 — End: 2018-04-22

## 2018-04-22 MED ORDER — LISINOPRIL 20 MG PO TABS
40.00 | ORAL_TABLET | ORAL | Status: DC
Start: 2018-04-23 — End: 2018-04-22

## 2018-04-22 MED ORDER — HEPARIN SODIUM (PORCINE) 5000 UNIT/ML IJ SOLN
5000.00 | INTRAMUSCULAR | Status: DC
Start: 2018-04-22 — End: 2018-04-22

## 2018-04-22 MED ORDER — FLUTICASONE PROPIONATE 50 MCG/ACT NA SUSP
1.00 | NASAL | Status: DC
Start: 2018-04-23 — End: 2018-04-22

## 2018-04-22 MED ORDER — IPRATROPIUM BROMIDE 0.02 % IN SOLN
0.50 | RESPIRATORY_TRACT | Status: DC
Start: ? — End: 2018-04-22

## 2018-04-22 MED ORDER — ISOSORBIDE MONONITRATE ER 30 MG PO TB24
30.00 | ORAL_TABLET | ORAL | Status: DC
Start: 2018-04-23 — End: 2018-04-22

## 2018-04-22 MED ORDER — GENERIC EXTERNAL MEDICATION
3.38 g | Status: DC
Start: 2018-04-22 — End: 2018-04-22

## 2018-04-22 MED ORDER — ONDANSETRON HCL 4 MG/2ML IJ SOLN
4.00 | INTRAMUSCULAR | Status: DC
Start: ? — End: 2018-04-22

## 2018-04-22 MED ORDER — ACETAMINOPHEN 650 MG/20.3ML PO SOLN
650.00 | ORAL | Status: DC
Start: ? — End: 2018-04-22

## 2018-04-22 MED ORDER — POLYETHYLENE GLYCOL 3350 17 G PO PACK
17.00 g | PACK | ORAL | Status: DC
Start: ? — End: 2018-04-22

## 2018-04-22 MED ORDER — HYDRALAZINE HCL 20 MG/ML IJ SOLN
10.00 | INTRAMUSCULAR | Status: DC
Start: ? — End: 2018-04-22

## 2018-04-22 MED ORDER — PANTOPRAZOLE SODIUM 40 MG PO TBEC
40.00 | DELAYED_RELEASE_TABLET | ORAL | Status: DC
Start: 2018-04-23 — End: 2018-04-22

## 2018-04-22 MED ORDER — ATORVASTATIN CALCIUM 10 MG PO TABS
20.00 | ORAL_TABLET | ORAL | Status: DC
Start: 2018-04-22 — End: 2018-04-22

## 2018-04-22 MED ORDER — SODIUM CHLORIDE 0.9 % IJ SOLN
5.00 | INTRAMUSCULAR | Status: DC
Start: 2018-04-22 — End: 2018-04-22

## 2018-04-22 MED ORDER — ALBUTEROL SULFATE (5 MG/ML) 0.5% IN NEBU
2.50 | INHALATION_SOLUTION | RESPIRATORY_TRACT | Status: DC
Start: 2018-04-22 — End: 2018-04-22

## 2018-04-22 MED ORDER — IPRATROPIUM BROMIDE 0.02 % IN SOLN
.50 | RESPIRATORY_TRACT | Status: DC
Start: 2018-04-22 — End: 2018-04-22

## 2018-04-22 MED ORDER — HYDROCODONE-ACETAMINOPHEN 5-325 MG PO TABS
2.00 | ORAL_TABLET | ORAL | Status: DC
Start: ? — End: 2018-04-22

## 2018-04-22 MED ORDER — LORAZEPAM 0.5 MG PO TABS
.50 | ORAL_TABLET | ORAL | Status: DC
Start: ? — End: 2018-04-22

## 2018-04-22 MED ORDER — DILTIAZEM HCL ER COATED BEADS 120 MG PO CP24
120.00 | ORAL_CAPSULE | ORAL | Status: DC
Start: 2018-04-23 — End: 2018-04-22

## 2018-04-22 MED ORDER — SODIUM CHLORIDE 0.9 % IJ SOLN
5.00 | INTRAMUSCULAR | Status: DC
Start: ? — End: 2018-04-22

## 2018-04-22 MED ORDER — CALCITONIN (SALMON) 200 UNIT/ACT NA SOLN
1.00 | NASAL | Status: DC
Start: 2018-04-23 — End: 2018-04-22

## 2018-04-22 MED ORDER — GABAPENTIN 300 MG PO CAPS
600.00 | ORAL_CAPSULE | ORAL | Status: DC
Start: 2018-04-22 — End: 2018-04-22

## 2018-06-06 DEATH — deceased

## 2019-11-06 IMAGING — DX DG CHEST 2V
2 series · 2 of 2 positions shown · non-contrast
Comparison: 12/25/2017 and 10/24/2017. cervical spine CT
10/24/2017.

CLINICAL DATA: 83-year-old male with recent pneumonia. Cough,
congestion, shortness of breath.

EXAM:
CHEST - 2 VIEW

[dg chest 2 view (1 of 2)]
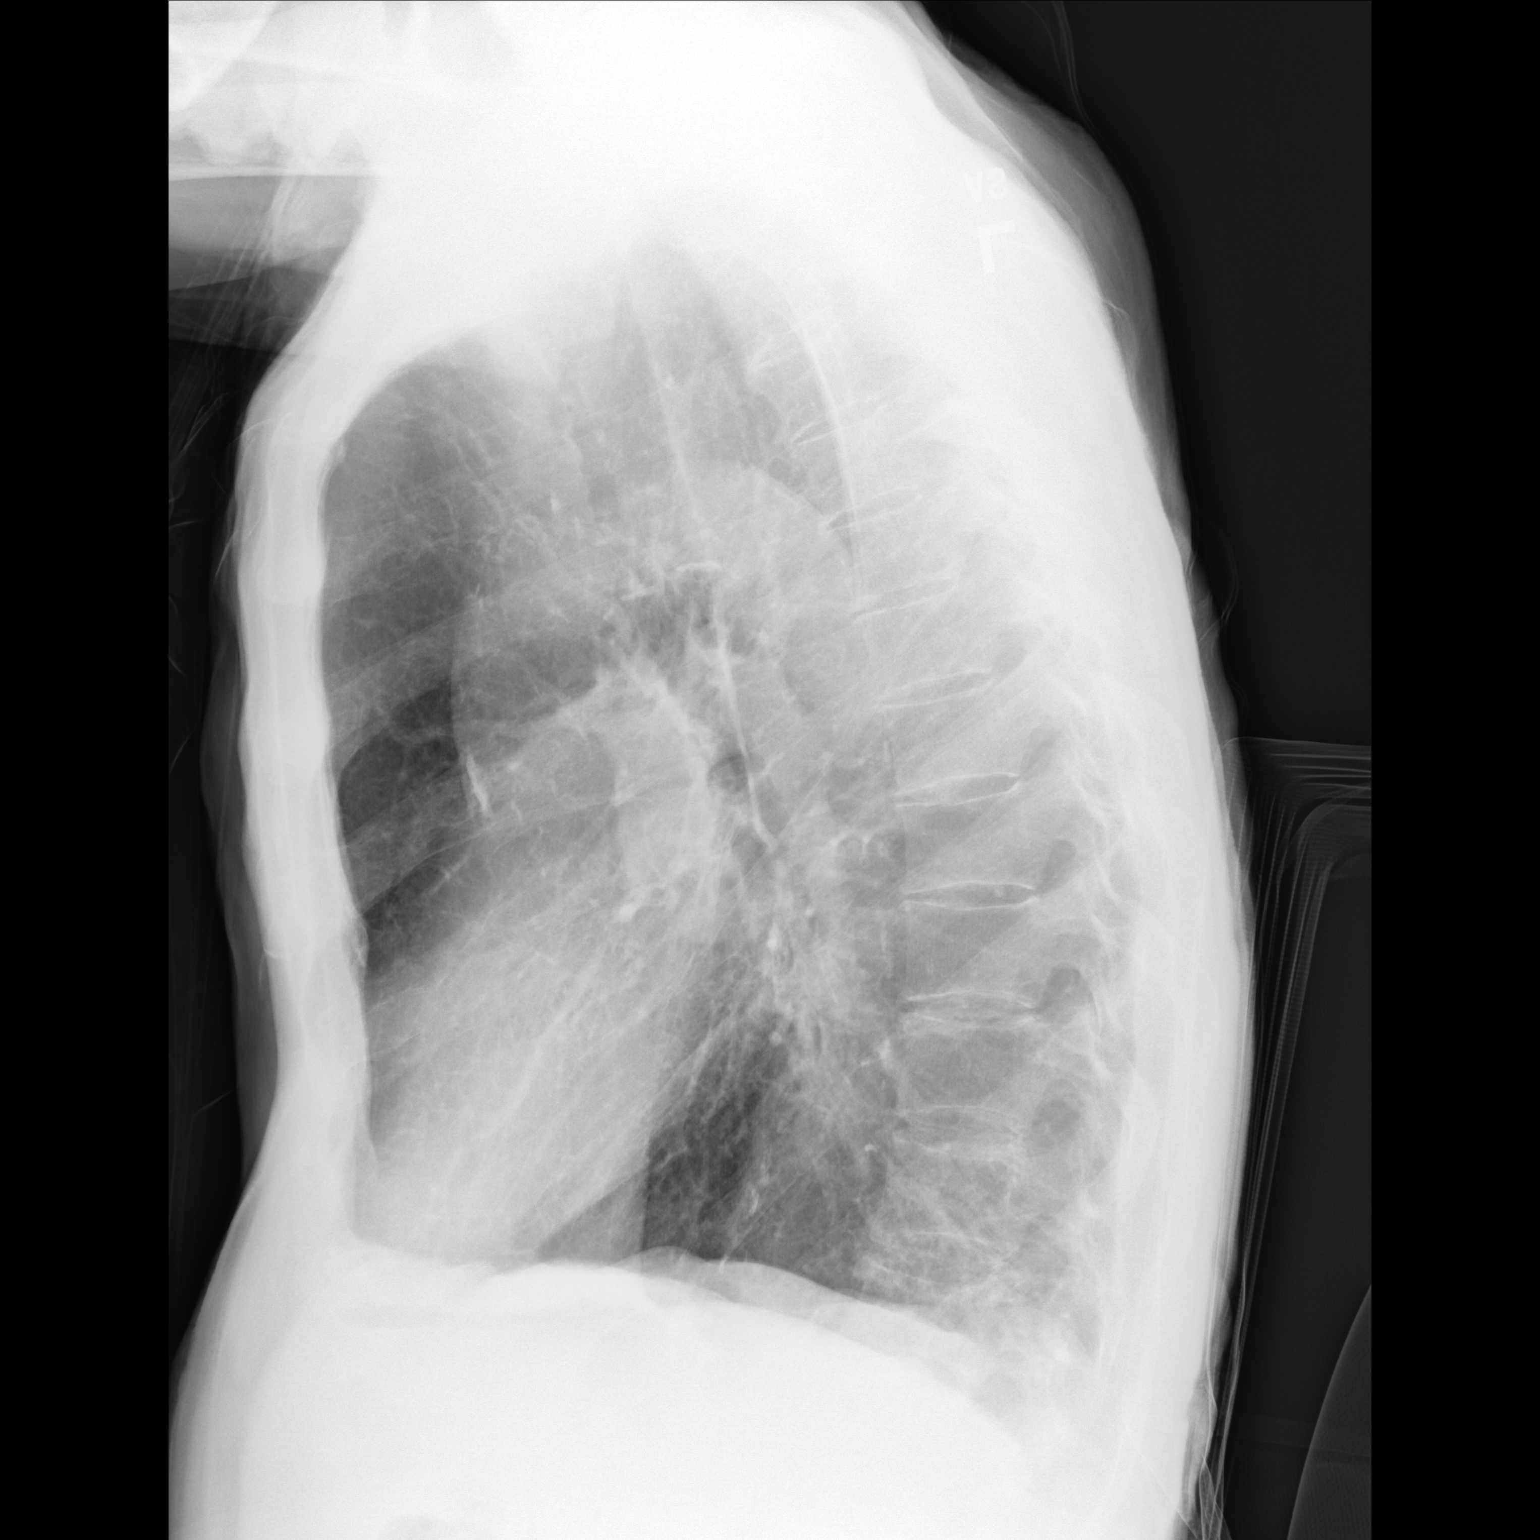

[dg chest 2 view (2 of 2)]
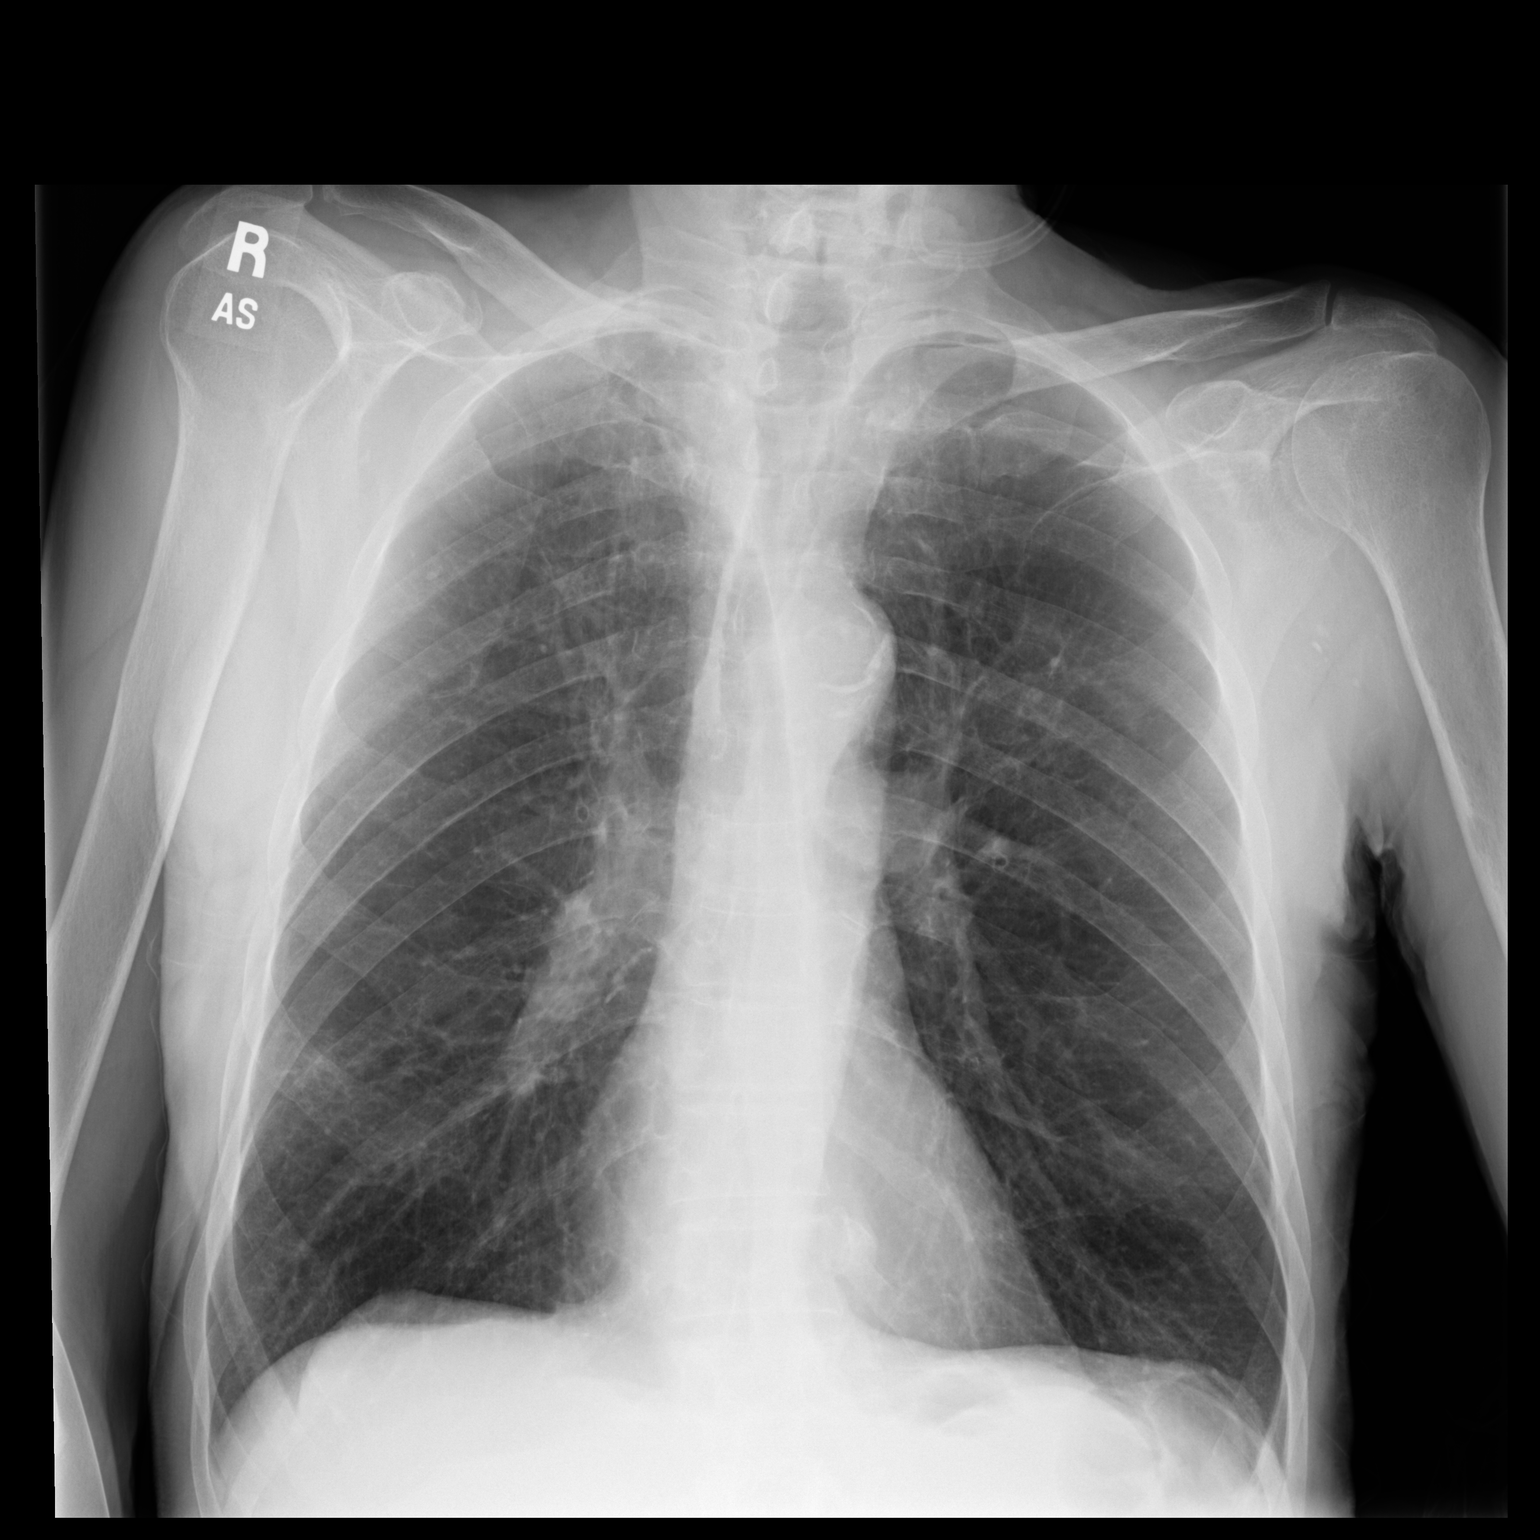

[2 of 2 positions shown; findings below may reference images not displayed]

FINDINGS: Seated AP and lateral views of the chest. Chronic large lung
volumes. Calcified aortic atherosclerosis. Other mediastinal
contours are within normal limits. Visualized tracheal air column is
within normal limits.

Bilateral pulmonary reticulonodular opacity, which was most
confluent in the right upper lung, has resolved. No pneumothorax,
pulmonary edema, pleural effusion or confluent opacity.

Possible mild superior endplate compression fracture at the
thoracolumbar junction on the lateral view, probably T12 or L1.
Otherwise Stable visualized osseous structures.
IMPRESSION: 1. Resolved pneumonia since [REDACTED]. Chronic lung disease with
Emphysema ([id number]).
2. Possible new mild lower spinal compression fracture at T12 or L1.
If there is back pain and specific therapy such as vertebroplasty is
desired, Lumbar MRI without contrast or Nuclear Medicine Whole-body
Bone Scan would best determine acuity.
3.  Aortic Atherosclerosis ([id number].I).
